# Patient Record
Sex: Female | Born: 1935 | Race: White | Hispanic: No | Marital: Married | Smoking: Never smoker
Health system: Southern US, Community
[De-identification: ages and names within clinical notes are randomized; demographics above are authoritative.]

## PROBLEM LIST (undated history)

## (undated) DIAGNOSIS — J189 Pneumonia, unspecified organism: Secondary | ICD-10-CM

## (undated) DIAGNOSIS — G250 Essential tremor: Secondary | ICD-10-CM

## (undated) DIAGNOSIS — M1990 Unspecified osteoarthritis, unspecified site: Secondary | ICD-10-CM

## (undated) DIAGNOSIS — E785 Hyperlipidemia, unspecified: Secondary | ICD-10-CM

## (undated) DIAGNOSIS — E119 Type 2 diabetes mellitus without complications: Secondary | ICD-10-CM

## (undated) DIAGNOSIS — C50412 Malignant neoplasm of upper-outer quadrant of left female breast: Secondary | ICD-10-CM

## (undated) DIAGNOSIS — K219 Gastro-esophageal reflux disease without esophagitis: Secondary | ICD-10-CM

## (undated) DIAGNOSIS — I10 Essential (primary) hypertension: Secondary | ICD-10-CM

## (undated) DIAGNOSIS — R269 Unspecified abnormalities of gait and mobility: Secondary | ICD-10-CM

## (undated) HISTORY — DX: Malignant neoplasm of upper-outer quadrant of left female breast: C50.412

## (undated) HISTORY — DX: Unspecified abnormalities of gait and mobility: R26.9

## (undated) HISTORY — PX: TUBAL LIGATION: SHX77

## (undated) HISTORY — PX: LEG SURGERY: SHX1003

## (undated) HISTORY — DX: Essential tremor: G25.0

## (undated) HISTORY — PX: WRIST SURGERY: SHX841

## (undated) NOTE — *Deleted (*Deleted)
Patient Care Team: Mosetta Putt, MD as PCP - General (Family Medicine) Claud Kelp, MD as Consulting Physician (General Surgery) Serena Croissant, MD as Consulting Physician (Hematology and Oncology) Antony Blackbird, MD as Consulting Physician (Radiation Oncology)  DIAGNOSIS:    ICD-10-CM   1. Malignant neoplasm of upper-outer quadrant of left breast in female, estrogen receptor positive (HCC)  C50.412    Z17.0     SUMMARY OF ONCOLOGIC HISTORY: Oncology History  Breast cancer of upper-outer quadrant of left female breast (HCC)  11/04/2015 Initial Diagnosis   Left breast biopsy 2:00 position: Invasive lobular cancer grade 1, ER 80%, PR 0%, Ki-67 5%, HER-2 negative ratio 1.19, screening detected left breast mass 1.6 cm, T1 cN0 stage IA clinical stage   12/17/2015 Surgery   Left Lumpectomy Derrell Lolling): ILC, 2.3 cm, Grade 1, ALH, ER 80%, PR 0%, Ki 67: 5%, HER-2 negative (1.19 ratio).   01/06/2016 -  Anti-estrogen oral therapy   Anastrozole 1 mg by mouth daily 5 years     CHIEF COMPLIANT: Follow-up of history of left breast cancer on anastrozole therapy  INTERVAL HISTORY: Rhonda [name] is a 41 y.o. with above-mentioned history of left breast cancer treated with a lumpectomy and who is currently on anastrozole. Mammogram on 12/07/19 showed no evidence of malignancy bilaterally. She presents to the clinic today for annual follow-up.  ALLERGIES:  is allergic to codeine.  MEDICATIONS:  Current Outpatient Medications  Medication Sig Dispense Refill  . allopurinol (ZYLOPRIM) 100 MG tablet Take 1 tablet (100 mg total) by mouth daily.    Marland Kitchen anastrozole (ARIMIDEX) 1 MG tablet Take 1 tablet (1 mg total) by mouth daily. 90 tablet 3  . aspirin 81 MG tablet Take 81 mg by mouth daily.    . Dulaglutide (TRULICITY) 1.5 MG/0.5ML SOPN Inject into the skin once a week.    . famotidine (PEPCID) 20 MG tablet Take 1 tablet (20 mg total) by mouth 2 (two) times daily.    . furosemide (LASIX) 20 MG tablet  Take 1 tablet (20 mg total) by mouth daily.    . hydrALAZINE (APRESOLINE) 50 MG tablet Take 50 mg by mouth 2 (two) times daily.    Marland Kitchen levothyroxine (SYNTHROID, LEVOTHROID) 50 MCG tablet Take 50 mcg by mouth daily before breakfast.    . losartan (COZAAR) 50 MG tablet Take 50 mg by mouth daily.    . metoprolol succinate (TOPROL-XL) 50 MG 24 hr tablet Take 50 mg by mouth daily. Take with or immediately following a meal.    . Multiple Vitamins-Minerals (MULTIVITAMIN & MINERAL PO) Take 1 tablet by mouth every morning.    . pravastatin (PRAVACHOL) 40 MG tablet Take 1 tablet (40 mg total) by mouth at bedtime. 30 tablet 0  . primidone (MYSOLINE) 250 MG tablet Take 0.5 tablets (125 mg total) by mouth 2 (two) times daily.    Marland Kitchen rOPINIRole (REQUIP) 1 MG tablet Take 1.5 mg by mouth at bedtime.    . sitaGLIPtin (JANUVIA) 50 MG tablet Take 2 tablets (100 mg total) by mouth daily.     No current facility-administered medications for this visit.    PHYSICAL EXAMINATION: ECOG PERFORMANCE STATUS: {CHL ONC ECOG PS:[phone]}  There were no vitals filed for this visit. There were no vitals filed for this visit.  BREAST:*** No palpable masses or nodules in either right or left breasts. No palpable axillary supraclavicular or infraclavicular adenopathy no breast tenderness or nipple discharge. (exam performed in the presence of a chaperone)  LABORATORY DATA:  I have reviewed the data as listed CMP Latest Ref Rng & Units 07/06/2017 12/16/2015 11/13/2015  Glucose 70 - 140 mg/dL 81 161(W) 960  BUN 7 - 26 mg/dL 45(W) 09(W) 11.9  Creatinine 0.60 - 1.10 mg/dL 1.47(W) 2.95(A) 2.1(H)  Sodium 136 - 145 mmol/L 139 134(L) 130(L)  Potassium 3.5 - 5.1 mmol/L 4.3 4.7 4.9  Chloride 98 - 109 mmol/L 99 98(L) -  CO2 22 - 29 mmol/L 28 28 23   Calcium 8.4 - 10.4 mg/dL 9.7 9.2 9.3  Total Protein 6.4 - 8.3 g/dL 7.9 - 7.9  Total Bilirubin 0.2 - 1.2 mg/dL 0.3 - <0.86  Alkaline Phos 40 - 150 U/L 105 - 66  AST 5 - 34 U/L 23 - 24   ALT 0 - 55 U/L 21 - 31    Lab Results  Component Value Date   WBC 6.9 07/06/2017   HGB 11.3 (L) 07/06/2017   HCT 33.9 (L) 07/06/2017   MCV 96.2 07/06/2017   PLT 210 07/06/2017   NEUTROABS 4.2 07/06/2017    ASSESSMENT & PLAN:  No problem-specific Assessment & Plan notes found for this encounter.    No orders of the defined types were placed in this encounter.  The patient has a good understanding of the overall plan. she agrees with it. she will call with any problems that may develop before the next visit here.  Total time spent: *** mins including face to face time and time spent for planning, charting and coordination of care  Serena Croissant, MD 03/25/2020  I, Kirt Boys Dorshimer, am acting as scribe for Dr. Serena Croissant.  {insert scribe attestation}

---

## 1999-04-03 ENCOUNTER — Encounter: Admission: RE | Admit: 1999-04-03 | Discharge: 1999-04-03 | Payer: Self-pay | Admitting: Emergency Medicine

## 1999-04-03 ENCOUNTER — Encounter: Payer: Self-pay | Admitting: Emergency Medicine

## 2000-05-04 ENCOUNTER — Other Ambulatory Visit: Admission: RE | Admit: 2000-05-04 | Discharge: 2000-05-04 | Payer: Self-pay | Admitting: Emergency Medicine

## 2000-11-26 ENCOUNTER — Encounter: Payer: Self-pay | Admitting: Emergency Medicine

## 2000-11-26 ENCOUNTER — Encounter: Admission: RE | Admit: 2000-11-26 | Discharge: 2000-11-26 | Payer: Self-pay | Admitting: Emergency Medicine

## 2002-06-01 ENCOUNTER — Encounter: Admission: RE | Admit: 2002-06-01 | Discharge: 2002-08-30 | Payer: Self-pay | Admitting: Emergency Medicine

## 2007-02-23 ENCOUNTER — Emergency Department (HOSPITAL_COMMUNITY): Admission: EM | Admit: 2007-02-23 | Discharge: 2007-02-23 | Payer: Self-pay | Admitting: Emergency Medicine

## 2007-05-24 ENCOUNTER — Emergency Department (HOSPITAL_COMMUNITY): Admission: EM | Admit: 2007-05-24 | Discharge: 2007-05-24 | Payer: Self-pay | Admitting: Emergency Medicine

## 2010-10-28 ENCOUNTER — Emergency Department (HOSPITAL_COMMUNITY)
Admission: EM | Admit: 2010-10-28 | Discharge: 2010-10-29 | Disposition: A | Discharge status: Home / Self Care | Payer: Medicare Other | Attending: Emergency Medicine | Admitting: Emergency Medicine

## 2010-10-28 ENCOUNTER — Emergency Department (HOSPITAL_COMMUNITY): Payer: Medicare Other

## 2010-10-28 DIAGNOSIS — I10 Essential (primary) hypertension: Secondary | ICD-10-CM | POA: Insufficient documentation

## 2010-10-28 DIAGNOSIS — G2581 Restless legs syndrome: Secondary | ICD-10-CM | POA: Insufficient documentation

## 2010-10-28 DIAGNOSIS — R1013 Epigastric pain: Secondary | ICD-10-CM | POA: Insufficient documentation

## 2010-10-28 DIAGNOSIS — R079 Chest pain, unspecified: Secondary | ICD-10-CM | POA: Insufficient documentation

## 2010-10-28 DIAGNOSIS — K219 Gastro-esophageal reflux disease without esophagitis: Secondary | ICD-10-CM | POA: Insufficient documentation

## 2010-10-28 LAB — COMPREHENSIVE METABOLIC PANEL
Alkaline Phosphatase: 78 U/L (ref 39–117)
BUN: 20 mg/dL (ref 6–23)
GFR calc non Af Amer: >60 mL/min (ref >60)
Glucose, Bld: 103 mg/dL — ABNORMAL HIGH (ref 70–99)
Potassium: 4 mEq/L (ref 3.5–5.1)
Total Protein: 7.3 g/dL (ref 6.0–8.3)

## 2010-10-28 LAB — CBC
HCT: 32.2 % — ABNORMAL LOW (ref 36.0–46.0)
Hemoglobin: 11 g/dL — ABNORMAL LOW (ref 12.0–15.0)
MCH: 30 pg (ref 26.0–34.0)
MCHC: 34.2 g/dL (ref 30.0–36.0)
MCV: 87.7 fL (ref 78.0–100.0)
Platelets: 215 10*3/uL (ref 150–400)
RBC: 3.67 MIL/uL — ABNORMAL LOW (ref 3.87–5.11)
RDW: 12.5 % (ref 11.5–15.5)
WBC: 5.9 10*3/uL (ref 4.0–10.5)

## 2010-10-29 LAB — LIPASE, BLOOD: Lipase: 39 U/L (ref 11–59)

## 2010-10-29 LAB — CK TOTAL AND CKMB (NOT AT ARMC)
Relative Index: INVALID (ref 0.0–2.5)
Total CK: 92 U/L (ref 7–177)

## 2011-03-05 LAB — BASIC METABOLIC PANEL
BUN: 11
CO2: 32
Chloride: 102
Creatinine, Ser: 0.82
Glucose, Bld: 121 — ABNORMAL HIGH

## 2011-03-05 LAB — DIFFERENTIAL
Basophils Relative: 1
Eosinophils Absolute: 0
Eosinophils Relative: 0
Neutrophils Relative %: 65

## 2011-03-05 LAB — D-DIMER, QUANTITATIVE: D-Dimer, Quant: 0.46

## 2011-03-05 LAB — POCT CARDIAC MARKERS
CKMB, poc: <1 — ABNORMAL LOW
Operator id: 270111
Troponin i, poc: <0.05

## 2011-03-05 LAB — CBC
MCHC: 33.7
MCV: 91.4
Platelets: 188
RDW: 14.1 — ABNORMAL HIGH

## 2011-10-30 ENCOUNTER — Ambulatory Visit (INDEPENDENT_AMBULATORY_CARE_PROVIDER_SITE_OTHER): Payer: Medicare Other | Admitting: *Deleted

## 2011-10-30 DIAGNOSIS — R0989 Other specified symptoms and signs involving the circulatory and respiratory systems: Secondary | ICD-10-CM

## 2011-11-05 NOTE — Procedures (Unsigned)
CAROTID DUPLEX EXAM  INDICATION:  Bilateral carotid bruit  HISTORY: Diabetes:  Yes Cardiac:  No Hypertension:  Yes Smoking:  Previous Previous Surgery:  No CV History: Amaurosis Fugax No, Paresthesias No, Hemiparesis No                                      RIGHT             LEFT Brachial systolic pressure:         140               150 Brachial Doppler waveforms:         WNL               WNL Vertebral direction of flow:        Antegrade         Antegrade DUPLEX VELOCITIES (cm/sec) CCA peak systolic                   73                104 ECA peak systolic                   158               79 ICA peak systolic                   98                164 ICA end diastolic                   25                48 PLAQUE MORPHOLOGY:                  Heterogeneous     Heterogeneous PLAQUE AMOUNT:                      Mild              Moderate PLAQUE LOCATION:                    ICA               ICA  IMPRESSION: 1. 1%-39% right internal carotid artery plaquing. 2. 40%-59% (low end) left internal carotid artery stenosis. 3. Bilateral vertebral arteries are within normal limits.  ___________________________________________ Larina Earthly, M.D.  LT/MEDQ  D:  10/30/2011  T:  10/30/2011  Job:  [id number]

## 2012-05-13 ENCOUNTER — Other Ambulatory Visit: Payer: Self-pay | Admitting: Family Medicine

## 2012-05-13 DIAGNOSIS — R270 Ataxia, unspecified: Secondary | ICD-10-CM

## 2012-05-13 DIAGNOSIS — R42 Dizziness and giddiness: Secondary | ICD-10-CM

## 2012-05-20 ENCOUNTER — Other Ambulatory Visit: Payer: Medicare Other

## 2012-05-20 ENCOUNTER — Ambulatory Visit
Admission: RE | Admit: 2012-05-20 | Discharge: 2012-05-20 | Disposition: A | Discharge status: Home / Self Care | Payer: Medicare Other | Source: Ambulatory Visit | Attending: Family Medicine | Admitting: Family Medicine

## 2012-05-20 DIAGNOSIS — R270 Ataxia, unspecified: Secondary | ICD-10-CM

## 2012-05-20 DIAGNOSIS — R42 Dizziness and giddiness: Secondary | ICD-10-CM

## 2012-05-20 MED ORDER — GADOBENATE DIMEGLUMINE 529 MG/ML IV SOLN
15.0000 mL | Freq: Once | INTRAVENOUS | Status: AC | PRN
  Administered 2012-05-20: 15 mL via INTRAVENOUS

## 2012-10-26 ENCOUNTER — Other Ambulatory Visit (INDEPENDENT_AMBULATORY_CARE_PROVIDER_SITE_OTHER): Payer: Medicare Other | Admitting: *Deleted

## 2012-10-26 ENCOUNTER — Encounter: Payer: Self-pay | Admitting: Family Medicine

## 2012-10-26 DIAGNOSIS — I6529 Occlusion and stenosis of unspecified carotid artery: Secondary | ICD-10-CM

## 2013-11-01 ENCOUNTER — Ambulatory Visit (HOSPITAL_COMMUNITY)
Admission: RE | Admit: 2013-11-01 | Discharge: 2013-11-01 | Disposition: A | Discharge status: Home / Self Care | Payer: Medicare Other | Source: Ambulatory Visit | Attending: Vascular Surgery | Admitting: Vascular Surgery

## 2013-11-01 ENCOUNTER — Other Ambulatory Visit (HOSPITAL_COMMUNITY): Payer: Self-pay | Admitting: Family Medicine

## 2013-11-01 DIAGNOSIS — I6529 Occlusion and stenosis of unspecified carotid artery: Secondary | ICD-10-CM | POA: Insufficient documentation

## 2013-11-01 DIAGNOSIS — R0989 Other specified symptoms and signs involving the circulatory and respiratory systems: Secondary | ICD-10-CM

## 2014-09-26 ENCOUNTER — Emergency Department (HOSPITAL_COMMUNITY)
Admission: EM | Admit: 2014-09-26 | Discharge: 2014-09-27 | Disposition: A | Discharge status: Home / Self Care | Payer: Medicare Other | Attending: Emergency Medicine | Admitting: Emergency Medicine

## 2014-09-26 DIAGNOSIS — R112 Nausea with vomiting, unspecified: Secondary | ICD-10-CM | POA: Diagnosis not present

## 2014-09-26 DIAGNOSIS — Z7982 Long term (current) use of aspirin: Secondary | ICD-10-CM | POA: Insufficient documentation

## 2014-09-26 DIAGNOSIS — R1084 Generalized abdominal pain: Secondary | ICD-10-CM | POA: Insufficient documentation

## 2014-09-26 DIAGNOSIS — Z79899 Other long term (current) drug therapy: Secondary | ICD-10-CM | POA: Insufficient documentation

## 2014-09-26 DIAGNOSIS — R197 Diarrhea, unspecified: Secondary | ICD-10-CM | POA: Diagnosis not present

## 2014-09-26 NOTE — ED Notes (Signed)
Pt comes from home with onset n/v/d starting this afternoon. Pt had acute onset of nausea with 3 episodes of diarrhea. Temp of 101.4. Pt took tylenol around 2pm.

## 2014-09-26 NOTE — ED Notes (Signed)
Bed: WA08 Expected date:  Expected time:  Means of arrival:  Comments: EMS 81 yo female N/V/D fever

## 2014-09-27 DIAGNOSIS — R112 Nausea with vomiting, unspecified: Secondary | ICD-10-CM | POA: Diagnosis not present

## 2014-09-27 LAB — CBC WITH DIFFERENTIAL/PLATELET
BASOS ABS: 0 10*3/uL (ref 0.0–0.1)
Basophils Relative: 0 % (ref 0–1)
Eosinophils Absolute: 0 10*3/uL (ref 0.0–0.7)
Eosinophils Relative: 0 % (ref 0–5)
HEMATOCRIT: 37.1 % (ref 36.0–46.0)
Hemoglobin: 12 g/dL (ref 12.0–15.0)
Lymphocytes Relative: 3 % — ABNORMAL LOW (ref 12–46)
Lymphs Abs: 0.2 10*3/uL — ABNORMAL LOW (ref 0.7–4.0)
MCH: 30.2 pg (ref 26.0–34.0)
MCHC: 32.3 g/dL (ref 30.0–36.0)
MCV: 93.2 fL (ref 78.0–100.0)
MONO ABS: 0.2 10*3/uL (ref 0.1–1.0)
Monocytes Relative: 3 % (ref 3–12)
Neutro Abs: 5.8 10*3/uL (ref 1.7–7.7)
Neutrophils Relative %: 94 % — ABNORMAL HIGH (ref 43–77)
Platelets: 157 10*3/uL (ref 150–400)
RBC: 3.98 MIL/uL (ref 3.87–5.11)
RDW: 13.1 % (ref 11.5–15.5)
WBC: 6.1 10*3/uL (ref 4.0–10.5)

## 2014-09-27 LAB — COMPREHENSIVE METABOLIC PANEL
ALBUMIN: 3.7 g/dL (ref 3.5–5.0)
ALT: 31 U/L (ref 14–54)
ANION GAP: 8 (ref 5–15)
AST: 30 U/L (ref 15–41)
Alkaline Phosphatase: 54 U/L (ref 38–126)
BUN: 21 mg/dL — ABNORMAL HIGH (ref 6–20)
CHLORIDE: 107 mmol/L (ref 101–111)
CO2: 22 mmol/L (ref 22–32)
Calcium: 8.3 mg/dL — ABNORMAL LOW (ref 8.9–10.3)
Creatinine, Ser: 1.04 mg/dL — ABNORMAL HIGH (ref 0.44–1.00)
GFR calc non Af Amer: 50 mL/min — ABNORMAL LOW (ref >60)
GFR, EST AFRICAN AMERICAN: 58 mL/min — AB (ref >60)
Glucose, Bld: 170 mg/dL — ABNORMAL HIGH (ref 70–99)
POTASSIUM: 4.5 mmol/L (ref 3.5–5.1)
SODIUM: 137 mmol/L (ref 135–145)
Total Bilirubin: 0.3 mg/dL (ref 0.3–1.2)
Total Protein: 6.8 g/dL (ref 6.5–8.1)

## 2014-09-27 LAB — URINE MICROSCOPIC-ADD ON

## 2014-09-27 LAB — I-STAT CG4 LACTIC ACID, ED
Lactic Acid, Venous: 2.24 mmol/L (ref 0.5–2.0)
Lactic Acid, Venous: 1.46 mmol/L (ref 0.5–2.0)

## 2014-09-27 LAB — URINALYSIS, ROUTINE W REFLEX MICROSCOPIC
BILIRUBIN URINE: NEGATIVE
Glucose, UA: NEGATIVE mg/dL
HGB URINE DIPSTICK: NEGATIVE
Ketones, ur: NEGATIVE mg/dL
Nitrite: NEGATIVE
PROTEIN: NEGATIVE mg/dL
Specific Gravity, Urine: 1.019 (ref 1.005–1.030)
Urobilinogen, UA: 0.2 mg/dL (ref 0.0–1.0)
pH: 5.5 (ref 5.0–8.0)

## 2014-09-27 LAB — I-STAT TROPONIN, ED: Troponin i, poc: 0 ng/mL (ref 0.00–0.08)

## 2014-09-27 LAB — LIPASE, BLOOD: Lipase: 54 U/L — ABNORMAL HIGH (ref 22–51)

## 2014-09-27 MED ORDER — ROPINIROLE HCL 1 MG PO TABS
1.0000 mg | ORAL_TABLET | ORAL | Status: AC
  Administered 2014-09-27: 1 mg via ORAL
  Filled 2014-09-27: qty 1

## 2014-09-27 MED ORDER — ACETAMINOPHEN 325 MG PO TABS
650.0000 mg | ORAL_TABLET | Freq: Once | ORAL | Status: AC
  Administered 2014-09-27: 650 mg via ORAL
  Filled 2014-09-27: qty 2

## 2014-09-27 MED ORDER — ONDANSETRON HCL 4 MG PO TABS: 4.0000 mg | ORAL_TABLET | Freq: Four times a day (QID) | ORAL | Status: DC

## 2014-09-27 MED ORDER — SODIUM CHLORIDE 0.9 % IV BOLUS (SEPSIS)
500.0000 mL | INTRAVENOUS | Status: AC
  Administered 2014-09-27: 500 mL via INTRAVENOUS

## 2014-09-27 MED ORDER — GI COCKTAIL ~~LOC~~
30.0000 mL | Freq: Once | ORAL | Status: AC
  Administered 2014-09-27: 30 mL via ORAL
  Filled 2014-09-27: qty 30

## 2014-09-27 NOTE — Discharge Instructions (Signed)
Please follow the directions provided. Be sure to follow-up with your primary care doctor to ensure you're getting better. Continue to drink plenty of fluids to stay well hydrated. May use Tylenol every 4 hours for any chills or fever. May use the Zofran as needed for nausea. Don't hesitate to return for any new, worsening, or concerning symptoms.   SEEK IMMEDIATE MEDICAL CARE IF:  You are unable to keep fluids down.  You do not urinate at least once every 6 to 8 hours.  You develop shortness of breath.  You notice blood in your stool or vomit. This may look like coffee grounds.  You have abdominal pain that increases or is concentrated in one small area (localized).  You have persistent vomiting or diarrhea.  You have a fever.

## 2014-09-27 NOTE — ED Provider Notes (Signed)
CSN: [id number]     Arrival date & time 09/26/14  2234 History   First MD Initiated Contact with Patient 09/27/14 0136     Chief Complaint  Patient presents with  . N/V/D    (Consider location/radiation/quality/duration/timing/severity/associated sxs/prior Treatment) HPI Rhonda [name] is a 81 yo female presenting with report of N/V/D. She states she woke up this morning and began having several episodes of diarrhea. She took an immodium and the diarrhea resolved.  She had a dentist appointment today and noticed the teeth cleaning paste made her feel more nauseated than usual.  She tried to eat lunch and became very nauseated and vomited.  She reports vomiting 6-7 times.  The last time was prior to being transported to the ED before receiving zofran from EMS.  She reports feeling chills throughout the day and once while using the bathroom she became very weak and her husband could not help her stand.  She reports mild abd pain but nothing significant.  She denies bilious/bloody emesis or melena or hematachezia.     No past medical history on file. No past surgical history on file. No family history on file. History  Substance Use Topics  . Smoking status: Not on file  . Smokeless tobacco: Not on file  . Alcohol Use: Not on file   OB History    No data available     Review of Systems  Constitutional: Positive for chills. Negative for fever.  HENT: Negative for sore throat.   Eyes: Negative for visual disturbance.  Respiratory: Negative for cough and shortness of breath.   Cardiovascular: Negative for chest pain and leg swelling.  Gastrointestinal: Positive for nausea, vomiting, abdominal pain and diarrhea.  Genitourinary: Negative for dysuria.  Musculoskeletal: Negative for myalgias.  Skin: Negative for rash.  Neurological: Negative for weakness, numbness and headaches.      Allergies  Codeine  Home Medications   Prior to Admission medications   Medication Sig Start Date  End Date Taking? Authorizing Provider  acetaminophen (TYLENOL) 500 MG chewable tablet Chew 1,000 mg by mouth every 6 (six) hours as needed for fever (fever).   Yes Historical Provider, MD  aspirin 81 MG tablet Take 81 mg by mouth daily.   Yes Historical Provider, MD  Calcium Carbonate-Vitamin D (CALTRATE 600+D PO) Take 2 tablets by mouth daily.   Yes Historical Provider, MD  Ferrous Sulfate (IRON) 325 (65 FE) MG TABS Take 1 tablet by mouth daily.   Yes Historical Provider, MD  losartan (COZAAR) 50 MG tablet Take 50 mg by mouth daily.   Yes Historical Provider, MD  metFORMIN (GLUCOPHAGE-XR) 500 MG 24 hr tablet Take 1,000 mg by mouth 2 (two) times daily.   Yes Historical Provider, MD  Multiple Vitamins-Minerals (MULTIVITAMIN & MINERAL PO) Take 1 tablet by mouth every morning.   Yes Historical Provider, MD  pravastatin (PRAVACHOL) 80 MG tablet Take 80 mg by mouth at bedtime.   Yes Historical Provider, MD  rOPINIRole (REQUIP) 1 MG tablet Take 1.5 mg by mouth at bedtime.   Yes Historical Provider, MD  sitaGLIPtin (JANUVIA) 100 MG tablet Take 100 mg by mouth daily.   Yes Historical Provider, MD   BP 124/49 mmHg  Pulse 101  Temp(Src) 99.4 F (37.4 C) (Oral)  Resp 20  SpO2 95% Physical Exam  Constitutional: She is oriented to person, place, and time. She appears well-developed and well-nourished. No distress.  HENT:  Head: Normocephalic and atraumatic.  Mouth/Throat: Mucous membranes are dry.  Eyes:  Conjunctivae are normal.  Neck: Neck supple.  Cardiovascular: Normal rate, regular rhythm and intact distal pulses.   Pulmonary/Chest: Effort normal and breath sounds normal. No respiratory distress. She has no wheezes. She has no rales. She exhibits no tenderness.  Abdominal: Soft. She exhibits no distension and no mass. There is no hepatosplenomegaly. There is generalized tenderness. There is no rigidity, no rebound, no guarding, no CVA tenderness, no tenderness at McBurney's point and negative  Murphy's sign.    Mild general tenderness, worse over epigastrium, NO localized LLQ or RLQ TTP  Musculoskeletal: She exhibits no tenderness.  Lymphadenopathy:    She has no cervical adenopathy.  Neurological: She is alert and oriented to person, place, and time. No cranial nerve deficit. Coordination normal.  Skin: Skin is warm and dry. No rash noted. She is not diaphoretic.  Psychiatric: She has a normal mood and affect.  Nursing note and vitals reviewed.   ED Course  Procedures (including critical care time) Labs Review Labs Reviewed  CBC WITH DIFFERENTIAL/PLATELET - Abnormal; Notable for the following:    Neutrophils Relative % 94 (*)    Lymphocytes Relative 3 (*)    Lymphs Abs 0.2 (*)    All other components within normal limits  COMPREHENSIVE METABOLIC PANEL - Abnormal; Notable for the following:    Glucose, Bld 170 (*)    BUN 21 (*)    Creatinine, Ser 1.04 (*)    Calcium 8.3 (*)    GFR calc non Af Amer 50 (*)    GFR calc Af Amer 58 (*)    All other components within normal limits  LIPASE, BLOOD - Abnormal; Notable for the following:    Lipase 54 (*)    All other components within normal limits  URINALYSIS, ROUTINE W REFLEX MICROSCOPIC - Abnormal; Notable for the following:    Leukocytes, UA TRACE (*)    All other components within normal limits  URINE MICROSCOPIC-ADD ON - Abnormal; Notable for the following:    Bacteria, UA FEW (*)    All other components within normal limits  I-STAT CG4 LACTIC ACID, ED - Abnormal; Notable for the following:    Lactic Acid, Venous 2.24 (*)    All other components within normal limits  I-STAT TROPOININ, ED  I-STAT CG4 LACTIC ACID, ED    Imaging Review No results found.   EKG Interpretation   Date/Time:  Wednesday Sep 26 2014 23:42:11 EDT Ventricular Rate:  97 PR Interval:  137 QRS Duration: 74 QT Interval:  335 QTC Calculation: 425 R Axis:   -16 Text Interpretation:  Sinus rhythm Borderline left axis deviation Low    voltage, precordial leads since last tracing no significant change  Confirmed by WENTZ  MD, ELLIOTT ([id number]) on 09/27/2014 12:19:10 AM      MDM   Final diagnoses:  Nausea vomiting and diarrhea   81 yo with nausea, vomiting and diarrhea today. She had an episode of weakness while sitting on the toilet but that has resolved after IVF.  Her nausea, vomiting and diarrhea has not recurred since being in the ED after treatment with zofran.  She was febrile in the ED with elevated lactate to 2.24 but normal WBC and no focal abd tenderness, consistent with a viral gastroenteritis and dehydration. Discussed case with Dr. Sharol Given. Pt given additional IVF, tylenol and PO challenged.  Pt defervesced after treatment. Pt tolerating POs, epigastric discomfort resolved after GI cocktail.  Pt ambulated in hall without weakness or difficulty. Her repeat lactic acid  is normal.  Pt is well-appearing, in no acute distress and vital signs reviewed an not concerning. She appears safe to be discharged.  Discharge include follow-up with their PCP.  Return precautions provided. Pt aware of plan and in agreement.   Filed Vitals:   09/27/14 0059 09/27/14 0200 09/27/14 0206 09/27/14 0438  BP: 124/49 146/59  136/46  Pulse: 101 100  97  Temp:   102.8 F (39.3 C) 98.4 F (36.9 C)  TempSrc:   Oral Oral  Resp: 20 28  21   SpO2: 95% 94%  94%   Meds given in ED:  Medications  sodium chloride 0.9 % bolus 500 mL (0 mLs Intravenous Stopped 09/27/14 0441)  acetaminophen (TYLENOL) tablet 650 mg (650 mg Oral Given 09/27/14 0234)  rOPINIRole (REQUIP) tablet 1 mg (1 mg Oral Given 09/27/14 0313)  gi cocktail (Maalox,Lidocaine,Donnatal) (30 mLs Oral Given 09/27/14 0432)    Discharge Medication List as of 09/27/2014  5:11 AM    START taking these medications   Details  ondansetron (ZOFRAN) 4 MG tablet Take 1 tablet (4 mg total) by mouth every 6 (six) hours., Starting 09/27/2014, Until Discontinued, Print           Britt Bottom,  NP 09/27/14 [place]  Linton Flemings, MD 09/28/14 [phone]

## 2014-09-27 NOTE — ED Notes (Signed)
Pt ambulated well with walker.

## 2014-09-27 NOTE — ED Notes (Signed)
Lactic acid was given to MD.

## 2014-09-27 NOTE — ED Notes (Signed)
Informed the pt that a urine specimen is needed. 

## 2015-09-03 ENCOUNTER — Ambulatory Visit
Admission: RE | Admit: 2015-09-03 | Discharge: 2015-09-03 | Disposition: A | Discharge status: Home / Self Care | Payer: Medicare Other | Source: Ambulatory Visit | Attending: Family Medicine | Admitting: Family Medicine

## 2015-09-03 ENCOUNTER — Other Ambulatory Visit: Payer: Self-pay | Admitting: Family Medicine

## 2015-09-03 DIAGNOSIS — R0989 Other specified symptoms and signs involving the circulatory and respiratory systems: Secondary | ICD-10-CM

## 2015-09-07 ENCOUNTER — Emergency Department (HOSPITAL_BASED_OUTPATIENT_CLINIC_OR_DEPARTMENT_OTHER)
Admission: EM | Admit: 2015-09-07 | Discharge: 2015-09-07 | Disposition: A | Discharge status: Home / Self Care | Payer: Medicare Other | Attending: Emergency Medicine | Admitting: Emergency Medicine

## 2015-09-07 ENCOUNTER — Emergency Department (HOSPITAL_BASED_OUTPATIENT_CLINIC_OR_DEPARTMENT_OTHER): Payer: Medicare Other

## 2015-09-07 ENCOUNTER — Encounter (HOSPITAL_BASED_OUTPATIENT_CLINIC_OR_DEPARTMENT_OTHER): Payer: Self-pay

## 2015-09-07 DIAGNOSIS — I10 Essential (primary) hypertension: Secondary | ICD-10-CM | POA: Diagnosis not present

## 2015-09-07 DIAGNOSIS — Z7982 Long term (current) use of aspirin: Secondary | ICD-10-CM | POA: Insufficient documentation

## 2015-09-07 DIAGNOSIS — Y999 Unspecified external cause status: Secondary | ICD-10-CM | POA: Diagnosis not present

## 2015-09-07 DIAGNOSIS — Y939 Activity, unspecified: Secondary | ICD-10-CM | POA: Diagnosis not present

## 2015-09-07 DIAGNOSIS — E785 Hyperlipidemia, unspecified: Secondary | ICD-10-CM | POA: Insufficient documentation

## 2015-09-07 DIAGNOSIS — R51 Headache: Secondary | ICD-10-CM | POA: Diagnosis present

## 2015-09-07 DIAGNOSIS — E119 Type 2 diabetes mellitus without complications: Secondary | ICD-10-CM | POA: Insufficient documentation

## 2015-09-07 DIAGNOSIS — W06XXXA Fall from bed, initial encounter: Secondary | ICD-10-CM | POA: Diagnosis not present

## 2015-09-07 DIAGNOSIS — Z7984 Long term (current) use of oral hypoglycemic drugs: Secondary | ICD-10-CM | POA: Diagnosis not present

## 2015-09-07 DIAGNOSIS — Y929 Unspecified place or not applicable: Secondary | ICD-10-CM | POA: Insufficient documentation

## 2015-09-07 DIAGNOSIS — Z79899 Other long term (current) drug therapy: Secondary | ICD-10-CM | POA: Diagnosis not present

## 2015-09-07 DIAGNOSIS — W19XXXA Unspecified fall, initial encounter: Secondary | ICD-10-CM

## 2015-09-07 HISTORY — DX: Pneumonia, unspecified organism: J18.9

## 2015-09-07 HISTORY — DX: Hyperlipidemia, unspecified: E78.5

## 2015-09-07 HISTORY — DX: Essential (primary) hypertension: I10

## 2015-09-07 HISTORY — DX: Gastro-esophageal reflux disease without esophagitis: K21.9

## 2015-09-07 HISTORY — DX: Type 2 diabetes mellitus without complications: E11.9

## 2015-09-07 NOTE — ED Notes (Signed)
VS completed just prior to discharge, and all were WNL. VS did not cross over into chart.

## 2015-09-07 NOTE — ED Notes (Addendum)
Recent treatment for PNA. CBG 134.

## 2015-09-07 NOTE — ED Notes (Signed)
Pt reports fall out of bed, hit head against table, denies neck/back pain, no nausea, no emesis, no LOC during event, pt alert, oriented, appropriate, pt denies using assistive devices at home.

## 2015-09-07 NOTE — Discharge Instructions (Signed)
1. Medications: usual home medications 2. Treatment: rest, drink plenty of fluids 3. Follow Up: please followup with your primary doctor for discussion of your diagnoses and further evaluation after today's visit; please return to the ER for new or worsening symptoms

## 2015-09-07 NOTE — ED Provider Notes (Signed)
CSN: [id number]     Arrival date & time 09/07/15  2007 History   First MD Initiated Contact with Patient 09/07/15 2014     Chief Complaint  Patient presents with  . Fall    HPI   Rhonda [name] is a 82 y.o. female with a PMH of DM, HTN, HLD who presents to the ED with fall. She states she was trying to sit down to get into her bed when she fell and hit the right side of her head on her bedside table. She reports she has mild soreness. She denies LOC, headache, lightheadedness, dizziness, vision changes, neck pain, back pain, nausea, vomiting, numbness, weakness, paresthesia, additional injury. She notes she lives in an assisted living center and pulled the call button for the nurse, who subsequently called EMS.   Past Medical History  Diagnosis Date  . Diabetes mellitus without complication ([place])   . Hypertension   . Pneumonia   . GERD (gastroesophageal reflux disease)   . Hyperlipidemia    History reviewed. No pertinent past surgical history. History reviewed. No pertinent family history. Social History  Substance Use Topics  . Smoking status: Never Smoker   . Smokeless tobacco: None  . Alcohol Use: No   OB History    No data available      Review of Systems  Eyes: Negative for visual disturbance.  Gastrointestinal: Negative for nausea and vomiting.  Musculoskeletal: Negative for back pain and neck pain.  Skin: Negative for wound.  Neurological: Negative for dizziness, syncope, weakness, light-headedness, numbness and headaches.  All other systems reviewed and are negative.     Allergies  Codeine  Home Medications   Prior to Admission medications   Medication Sig Start Date End Date Taking? Authorizing Provider  acetaminophen (TYLENOL) 500 MG chewable tablet Chew 1,000 mg by mouth every 6 (six) hours as needed for fever (fever).   Yes Historical Provider, MD  aspirin 81 MG tablet Take 81 mg by mouth daily.   Yes Historical Provider, MD  Calcium Carbonate-Vitamin  D (CALTRATE 600+D PO) Take 2 tablets by mouth daily.   Yes Historical Provider, MD  losartan (COZAAR) 50 MG tablet Take 50 mg by mouth daily.   Yes Historical Provider, MD  metFORMIN (GLUCOPHAGE-XR) 500 MG 24 hr tablet Take 1,000 mg by mouth 2 (two) times daily.   Yes Historical Provider, MD  Multiple Vitamins-Minerals (MULTIVITAMIN & MINERAL PO) Take 1 tablet by mouth every morning.   Yes Historical Provider, MD  ondansetron (ZOFRAN) 4 MG tablet Take 1 tablet (4 mg total) by mouth every 6 (six) hours. 09/27/14  Yes Britt Bottom, NP  pravastatin (PRAVACHOL) 80 MG tablet Take 80 mg by mouth at bedtime.   Yes Historical Provider, MD  rOPINIRole (REQUIP) 1 MG tablet Take 1.5 mg by mouth at bedtime.   Yes Historical Provider, MD  sitaGLIPtin (JANUVIA) 100 MG tablet Take 100 mg by mouth daily.   Yes Historical Provider, MD  Ferrous Sulfate (IRON) 325 (65 FE) MG TABS Take 1 tablet by mouth daily.    Historical Provider, MD    BP 156/73 mmHg  Pulse 79  Temp(Src) 98.4 F (36.9 C) (Oral)  Resp 16  SpO2 91% Physical Exam  Constitutional: She is oriented to person, place, and time. She appears well-developed and well-nourished. No distress.  HENT:  Head: Normocephalic and atraumatic. Head is without raccoon's eyes, without Battle's sign, without abrasion, without contusion and without laceration.  Right Ear: Hearing, tympanic membrane, external ear and  ear canal normal. No drainage. No hemotympanum.  Left Ear: Hearing, tympanic membrane, external ear and ear canal normal. No drainage. No hemotympanum.  Nose: Nose normal.  Mouth/Throat: Uvula is midline, oropharynx is clear and moist and mucous membranes are normal.  Eyes: Conjunctivae, EOM and lids are normal. Pupils are equal, round, and reactive to light. Right eye exhibits no discharge. Left eye exhibits no discharge. No scleral icterus.  Neck: Normal range of motion. Neck supple.  Cardiovascular: Normal rate, regular rhythm, normal heart  sounds, intact distal pulses and normal pulses.   Pulmonary/Chest: Effort normal and breath sounds normal. No respiratory distress. She has no wheezes. She has no rales.  Abdominal: Soft. Normal appearance and bowel sounds are normal. She exhibits no distension and no mass. There is no tenderness. There is no rigidity, no rebound and no guarding.  Musculoskeletal: Normal range of motion. She exhibits no edema or tenderness.  Neurological: She is alert and oriented to person, place, and time. She has normal strength. No cranial nerve deficit or sensory deficit. GCS eye subscore is 4. GCS verbal subscore is 5. GCS motor subscore is 6.  Skin: Skin is warm, dry and intact. No rash noted. She is not diaphoretic. No erythema. No pallor.  Psychiatric: She has a normal mood and affect. Her speech is normal and behavior is normal.  Nursing note and vitals reviewed.   ED Course  Procedures (including critical care time)  Labs Review Labs Reviewed - No data to display  Imaging Review Ct Head Wo Contrast  09/07/2015  CLINICAL DATA:  Fall with head trauma and posterior right head pain. EXAM: CT HEAD WITHOUT CONTRAST TECHNIQUE: Contiguous axial images were obtained from the base of the skull through the vertex without intravenous contrast. COMPARISON:  05/20/2012 brain MRI. FINDINGS: No evidence of parenchymal hemorrhage or extra-axial fluid collection. No mass lesion, mass effect, or midline shift. No CT evidence of acute infarction. Intracranial atherosclerosis. Nonspecific mild subcortical and periventricular white matter hypodensity, most in keeping with chronic small vessel ischemic change. Cerebral volume is age appropriate. No ventriculomegaly. The visualized paranasal sinuses are essentially clear. The mastoid air cells are unopacified. No evidence of calvarial fracture. IMPRESSION: 1. No evidence of acute intracranial abnormality. No evidence of calvarial fracture. 2. Intracranial atherosclerosis and  mild chronic small vessel ischemia. Electronically Signed   By: Ilona Sorrel M.D.   On: 09/07/2015 20:51   I have personally reviewed and evaluated these images as part of my medical decision-making.   EKG Interpretation None      MDM   Final diagnoses:  Fall  Fall, initial encounter    82 year old female presents with fall. States she hit the right side of her head while trying to sit in her bed. Denies LOC, headache, lightheadedness, dizziness, vision changes, neck pain, back pain, nausea, vomiting, numbness, weakness, paresthesia, additional injury.  Patient is afebrile. Vital signs stable. Head normocephalic and atraumatic. GCS 15. Normal neuro exam with no focal deficit. TMs clear bilaterally. Heart RRR. Lungs clear to auscultation bilaterally. Abdomen soft, non-tender, non-distended. No TTP to upper extremities, chest wall, pelvis, or lower extremities bilaterally. Patient moves all extremities without difficulty.  Head CT negative for acute intracranial abnormality. Patient discussed with and seen by Dr. Alfonse Spruce. She is nontoxic and well-appearing, feel she is stable for discharge at this time. Patient to follow-up with PCP. Strict return precautions discussed. Patient verbalizes her understanding and is in agreement with plan.  BP 156/73 mmHg  Pulse  79  Temp(Src) 98.4 F (36.9 C) (Oral)  Resp 16  SpO2 91%     Marella Chimes, PA-C 09/07/15 2206  Harvel Quale, MD 09/08/15 [phone]

## 2015-10-02 ENCOUNTER — Other Ambulatory Visit: Payer: Self-pay | Admitting: Family Medicine

## 2015-10-02 ENCOUNTER — Ambulatory Visit
Admission: RE | Admit: 2015-10-02 | Discharge: 2015-10-02 | Disposition: A | Discharge status: Home / Self Care | Payer: Medicare Other | Source: Ambulatory Visit | Attending: Family Medicine | Admitting: Family Medicine

## 2015-10-02 DIAGNOSIS — Z09 Encounter for follow-up examination after completed treatment for conditions other than malignant neoplasm: Secondary | ICD-10-CM

## 2015-11-04 ENCOUNTER — Other Ambulatory Visit: Payer: Self-pay | Admitting: Radiology

## 2015-11-06 ENCOUNTER — Telehealth: Payer: Self-pay | Admitting: *Deleted

## 2015-11-06 ENCOUNTER — Ambulatory Visit (HOSPITAL_COMMUNITY)
Admission: RE | Admit: 2015-11-06 | Discharge: 2015-11-06 | Disposition: A | Discharge status: Home / Self Care | Payer: Medicare Other | Source: Ambulatory Visit | Attending: Vascular Surgery | Admitting: Vascular Surgery

## 2015-11-06 ENCOUNTER — Encounter: Payer: Self-pay | Admitting: *Deleted

## 2015-11-06 ENCOUNTER — Other Ambulatory Visit (HOSPITAL_COMMUNITY): Payer: Self-pay | Admitting: Family Medicine

## 2015-11-06 DIAGNOSIS — E785 Hyperlipidemia, unspecified: Secondary | ICD-10-CM | POA: Diagnosis not present

## 2015-11-06 DIAGNOSIS — K219 Gastro-esophageal reflux disease without esophagitis: Secondary | ICD-10-CM | POA: Insufficient documentation

## 2015-11-06 DIAGNOSIS — E119 Type 2 diabetes mellitus without complications: Secondary | ICD-10-CM | POA: Insufficient documentation

## 2015-11-06 DIAGNOSIS — I6523 Occlusion and stenosis of bilateral carotid arteries: Secondary | ICD-10-CM

## 2015-11-06 DIAGNOSIS — I10 Essential (primary) hypertension: Secondary | ICD-10-CM | POA: Insufficient documentation

## 2015-11-06 DIAGNOSIS — C50412 Malignant neoplasm of upper-outer quadrant of left female breast: Secondary | ICD-10-CM

## 2015-11-06 HISTORY — DX: Malignant neoplasm of upper-outer quadrant of left female breast: C50.412

## 2015-11-06 NOTE — Telephone Encounter (Signed)
Confirmed [facility] for 11/13/15 at 1215 .  Instructions and contact information given.

## 2015-11-06 NOTE — Telephone Encounter (Signed)
Left vm for pt to return call concerning [place] on 6/21. Contact information provided.

## 2015-11-13 ENCOUNTER — Encounter: Payer: Self-pay | Admitting: General Practice

## 2015-11-13 ENCOUNTER — Ambulatory Visit: Payer: Medicare Other | Admitting: Physical Therapy

## 2015-11-13 ENCOUNTER — Ambulatory Visit
Admission: RE | Admit: 2015-11-13 | Discharge: 2015-11-13 | Disposition: A | Discharge status: Home / Self Care | Payer: Medicare Other | Source: Ambulatory Visit | Attending: Radiation Oncology | Admitting: Radiation Oncology

## 2015-11-13 ENCOUNTER — Encounter: Payer: Self-pay | Admitting: Hematology and Oncology

## 2015-11-13 ENCOUNTER — Other Ambulatory Visit (HOSPITAL_BASED_OUTPATIENT_CLINIC_OR_DEPARTMENT_OTHER): Payer: Medicare Other

## 2015-11-13 ENCOUNTER — Ambulatory Visit (HOSPITAL_BASED_OUTPATIENT_CLINIC_OR_DEPARTMENT_OTHER): Payer: Medicare Other | Admitting: Hematology and Oncology

## 2015-11-13 VITALS — BP 147/52 | HR 75 | Temp 97.8°F | Resp 18 | Ht 64.0 in | Wt 149.5 lb

## 2015-11-13 DIAGNOSIS — C50412 Malignant neoplasm of upper-outer quadrant of left female breast: Secondary | ICD-10-CM

## 2015-11-13 DIAGNOSIS — Z170 Estrogen receptor positive status [ER+]: Secondary | ICD-10-CM | POA: Diagnosis not present

## 2015-11-13 LAB — COMPREHENSIVE METABOLIC PANEL
ALT: 31 U/L (ref 0–55)
ANION GAP: 12 meq/L — AB (ref 3–11)
AST: 24 U/L (ref 5–34)
Albumin: 3.5 g/dL (ref 3.5–5.0)
Alkaline Phosphatase: 66 U/L (ref 40–150)
BUN: 22 mg/dL (ref 7.0–26.0)
CO2: 23 mEq/L (ref 22–29)
Calcium: 9.3 mg/dL (ref 8.4–10.4)
Chloride: 96 mEq/L — ABNORMAL LOW (ref 98–109)
Creatinine: 1.2 mg/dL — ABNORMAL HIGH (ref 0.6–1.1)
EGFR: 41 mL/min/{1.73_m2} — AB (ref >90)
GLUCOSE: 121 mg/dL (ref 70–140)
Potassium: 4.9 mEq/L (ref 3.5–5.1)
Sodium: 130 mEq/L — ABNORMAL LOW (ref 136–145)
TOTAL PROTEIN: 7.9 g/dL (ref 6.4–8.3)

## 2015-11-13 LAB — CBC WITH DIFFERENTIAL/PLATELET
BASO%: 0.6 % (ref 0.0–2.0)
Basophils Absolute: 0 10*3/uL (ref 0.0–0.1)
EOS ABS: 0 10*3/uL (ref 0.0–0.5)
EOS%: 0.6 % (ref 0.0–7.0)
HEMATOCRIT: 35.1 % (ref 34.8–46.6)
HEMOGLOBIN: 11.6 g/dL (ref 11.6–15.9)
LYMPH%: 12.3 % — ABNORMAL LOW (ref 14.0–49.7)
MCH: 29.4 pg (ref 25.1–34.0)
MCHC: 33.1 g/dL (ref 31.5–36.0)
MCV: 88.8 fL (ref 79.5–101.0)
MONO#: 0.7 10*3/uL (ref 0.1–0.9)
MONO%: 10.8 % (ref 0.0–14.0)
NEUT%: 75.7 % (ref 38.4–76.8)
NEUTROS ABS: 4.8 10*3/uL (ref 1.5–6.5)
PLATELETS: 191 10*3/uL (ref 145–400)
RBC: 3.95 10*6/uL (ref 3.70–5.45)
RDW: 14.3 % (ref 11.2–14.5)
WBC: 6.3 10*3/uL (ref 3.9–10.3)
lymph#: 0.8 10*3/uL — ABNORMAL LOW (ref 0.9–3.3)

## 2015-11-13 NOTE — Progress Notes (Signed)
[place] CONSULT NOTE  Patient Care Team: Derinda Late, MD as PCP - General (Family Medicine) [name], MD as Consulting Physician (General Surgery) Nicholas Lose, MD as Consulting Physician (Hematology and Oncology) Gery Pray, MD as Consulting Physician (Radiation Oncology)  CHIEF COMPLAINTS/PURPOSE OF CONSULTATION:  Newly diagnosed breast cancer  HISTORY OF PRESENTING ILLNESS:  Rhonda [name] 81 y.o. female is here because of recent diagnosis of left breast cancer. Patient had a routine screening mammogram that revealed left breast mass measuring 1.6 cm. Axilla was negative. There were also right breast calcifications which on biopsy with fibroadenoma. Ultrasound-guided biopsy of the left breast mass revealed grade 1 invasive lobular cancer that is ER positive PR negative HER-2 negative with a Ki-67 of 5%. She was presented at the multidisciplinary tumor board and she is here today to discuss a treatment plan accompanied by her family. Patient's husband reports that she does have chronic fatigue as well as restless leg syndrome.  I reviewed her records extensively and collaborated the history with the patient.  SUMMARY OF ONCOLOGIC HISTORY:   Breast cancer of upper-outer quadrant of left female breast ([place])   11/04/2015 Initial Diagnosis Left breast biopsy 2:00 position: Invasive lobular cancer grade 1, ER 80%, PR 0%, Ki-67 5%, HER-2 negative ratio 1.19, screening detected left breast mass 1.6 cm, T1 cN0 stage IA clinical stage   MEDICAL HISTORY:  Past Medical History  Diagnosis Date  . Diabetes mellitus without complication ([place])   . Hypertension   . Pneumonia   . GERD (gastroesophageal reflux disease)   . Hyperlipidemia   . Breast cancer of upper-outer quadrant of left female breast ([place]) 11/06/2015    SURGICAL HISTORY: History reviewed. No pertinent past surgical history.  SOCIAL HISTORY: Social History   Social History  . Marital Status:  Married    Spouse Name: N/A  . Number of Children: N/A  . Years of Education: N/A   Occupational History  . Not on file.   Social History Main Topics  . Smoking status: Never Smoker   . Smokeless tobacco: Not on file  . Alcohol Use: No  . Drug Use: No  . Sexual Activity: Not on file   Other Topics Concern  . Not on file   Social History Narrative    FAMILY HISTORY: History reviewed. No pertinent family history.  ALLERGIES:  is allergic to codeine.  MEDICATIONS:  Current Outpatient Prescriptions  Medication Sig Dispense Refill  . aspirin 81 MG tablet Take 81 mg by mouth daily.    . Calcium Carbonate-Vitamin D (CALTRATE 600+D PO) Take 2 tablets by mouth daily.    Marland Kitchen losartan (COZAAR) 50 MG tablet Take 50 mg by mouth daily.    . metFORMIN (GLUCOPHAGE-XR) 500 MG 24 hr tablet Take 1,000 mg by mouth 2 (two) times daily.    . Multiple Vitamins-Minerals (MULTIVITAMIN & MINERAL PO) Take 1 tablet by mouth every morning.    Marland Kitchen omeprazole (PRILOSEC) 20 MG capsule     . pravastatin (PRAVACHOL) 80 MG tablet Take 80 mg by mouth at bedtime.    . primidone (MYSOLINE) 250 MG tablet     . rOPINIRole (REQUIP) 1 MG tablet Take 1.5 mg by mouth at bedtime.    . sitaGLIPtin (JANUVIA) 100 MG tablet Take 100 mg by mouth daily.     No current facility-administered medications for this visit.    REVIEW OF SYSTEMS:   Constitutional: Denies fevers, chills or abnormal night sweats Eyes: Denies blurriness of vision, double  vision or watery eyes Ears, nose, mouth, throat, and face: Denies mucositis or sore throat Respiratory: Denies cough, dyspnea or wheezes Cardiovascular: Denies palpitation, chest discomfort or lower extremity swelling Gastrointestinal:  Denies nausea, heartburn or change in bowel habits Skin: Denies abnormal skin rashes Lymphatics: Denies new lymphadenopathy or easy bruising Neurological:Denies numbness, tingling or new weaknesses, Restless legs and fatigue Behavioral/Psych:  Mood is stable, no new changes  Breast:  Denies any palpable lumps or discharge All other systems were reviewed with the patient and are negative.  PHYSICAL EXAMINATION: ECOG PERFORMANCE STATUS: 1 - Symptomatic but completely ambulatory  Filed Vitals:   11/13/15 1249  BP: 147/52  Pulse: 75  Temp: 97.8 F (36.6 C)  Resp: 18   Filed Weights   11/13/15 1249  Weight: 149 lb 8 oz (67.813 kg)    GENERAL:alert, no distress and comfortable SKIN: skin color, texture, turgor are normal, no rashes or significant lesions EYES: normal, conjunctiva are pink and non-injected, sclera clear OROPHARYNX:no exudate, no erythema and lips, buccal mucosa, and tongue normal  NECK: supple, thyroid normal size, non-tender, without nodularity LYMPH:  no palpable lymphadenopathy in the cervical, axillary or inguinal LUNGS: clear to auscultation and percussion with normal breathing effort HEART: regular rate & rhythm and no murmurs and no lower extremity edema ABDOMEN:abdomen soft, non-tender and normal bowel sounds Musculoskeletal:no cyanosis of digits and no clubbing  PSYCH: alert & oriented x 3 with fluent speech NEURO: no focal motor/sensory deficits BREAST: No palpable nodules in breast. No palpable axillary or supraclavicular lymphadenopathy (exam performed in the presence of a chaperone)   LABORATORY DATA:  I have reviewed the data as listed Lab Results  Component Value Date   WBC 6.3 11/13/2015   HGB 11.6 11/13/2015   HCT 35.1 11/13/2015   MCV 88.8 11/13/2015   PLT 191 11/13/2015   Lab Results  Component Value Date   NA 130* 11/13/2015   K 4.9 11/13/2015   CL 107 09/27/2014   CO2 23 11/13/2015    RADIOGRAPHIC STUDIES: I have personally reviewed the radiological reports and agreed with the findings in the report.  ASSESSMENT AND PLAN:  Breast cancer of upper-outer quadrant of left female breast ([place]) 11/04/2015 Left breast biopsy 2:00 position: Invasive lobular cancer grade 1, ER  80%, PR 0%, Ki-67 5%, HER-2 negative ratio 1.19, screening detected left breast mass 1.6 cm, T1 cN0 stage IA clinical stage  Pathology and radiology counseling:Discussed with the patient, the details of pathology including the type of breast cancer,the clinical staging, the significance of ER, PR and HER-2/neu receptors and the implications for treatment. After reviewing the pathology in detail, we proceeded to discuss the different treatment options between surgery, radiation, and antiestrogen therapies.  Recommendations: 1. Breast conserving surgery followed by 2. +/- Adjuvant radiation therapy followed by 3. Adjuvant antiestrogen therapy Return to clinic after surgery to discuss final pathology report    All questions were answered. The patient knows to call the clinic with any problems, questions or concerns.    Rulon Eisenmenger, MD 11/13/2015

## 2015-11-13 NOTE — Progress Notes (Signed)
Met with patient and family at Breast Multidisciplinary clinic to introduce support center team/resources, reviewing distress screen per protocol.  The patient scored a 4 on the Psychosocial Distress Thermometer which indicates moderate distress.  Also assessed for distress and other psychosocial needs.  ONCBCN DISTRESS SCREENING 11/13/2015  Screening Type Initial Screening  Distress experienced in past week (1-10) 4  Emotional problem type Nervousness/Anxiety;Adjusting to illness  Information Concerns Type Lack of info about diagnosis;Lack of info about treatment   Counselor used open question to assess how client is doing with new diagnosis. Counselor used reflection of content regarding client's stress and anxiety, and explored support the client has. Client reported having strong family support and her faith being an integral coping mechanism for her. Counselor used open question to check in with the rest of the family, and provided active listening and reflection of content and feelings. Client reported feeling less stressed after gaining more information about her diagnosis, and meeting with all the doctors.    Follow up needed:  [N]  Pt is aware of ongoing Support Team availability and contact information. Please also call as needs arise/circumstances change.  Thank you.  Wendall Papa, MS, [place], [facility] Counseling Intern-Department for Spiritual Care and [place], [place], [place]

## 2015-11-13 NOTE — Progress Notes (Addendum)
Radiation Oncology         [phone] ________________________________  Initial Outpatient Consultation  Name: Rhonda [name] MRN: [id number]  Date: 11/13/2015  DOB: 11-23-1932  AJ:OINOMVEH,MCNOB F, MD  Derinda Late, MD   REFERRING PHYSICIAN: Derinda Late, MD  DIAGNOSIS: The encounter diagnosis was Breast cancer of upper-outer quadrant of left female breast ([place]).  Clinical stage I invasive lobular carcinoma of the left breast  HISTORY OF PRESENT ILLNESS::Rhonda [name] is a 82 y.o. female who had a screening mammogram at [facility] on 0/9/62. This noted a 1 cm area of grouped amorphous heterogeneous calcifications in the UIQ of the right breast posterior depth and a possible mass in the UIQ left breast posterior depth. A diagnostic bilateral mammogram was performed on 10/28/15. The left breast density visible on the initial mammogram was no longer present. However, a new group of calcifications was noted in the 1 o'clock position in the right breast and a new architectural distortion in the UOQ of the left breast was visualized. Ultrasound the same day revealed a 1.6 cm irregular mass in the UOQ of the left breast.  Bilateral breast biopsies were conducted on 11/04/15. Biopsy of the 1 o'clock position of the right breast revealed hyalinized fibroadenoma with calcifications and no evidence of malignancy. Biopsy of the 2 o'clock position of the left breast revealed grade 1 invasive mammary carcinoma (lobular)(ER 80% positive, PR 0% negative, HER2 negative, Ki67 5%).  The patient presents to multidisciplinary breast clinic to discuss the role of radiation in the management of her disease.  PREVIOUS RADIATION THERAPY: No  PAST MEDICAL HISTORY:  has a past medical history of Diabetes mellitus without complication ([place]); Hypertension; Pneumonia; GERD (gastroesophageal reflux disease); Hyperlipidemia; and Breast cancer of upper-outer quadrant of left female breast ([place]) (11/06/2015).     PAST SURGICAL HISTORY:No past surgical history on file.  FAMILY HISTORY: family history is not on file.  SOCIAL HISTORY:  reports that she has never smoked. She does not have any smokeless tobacco history on file. She reports that she does not drink alcohol or use illicit drugs.  ALLERGIES: Codeine  MEDICATIONS:  Current Outpatient Prescriptions  Medication Sig Dispense Refill  . aspirin 81 MG tablet Take 81 mg by mouth daily.    . Calcium Carbonate-Vitamin D (CALTRATE 600+D PO) Take 2 tablets by mouth daily.    Marland Kitchen losartan (COZAAR) 50 MG tablet Take 50 mg by mouth daily.    . metFORMIN (GLUCOPHAGE-XR) 500 MG 24 hr tablet Take 1,000 mg by mouth 2 (two) times daily.    . Multiple Vitamins-Minerals (MULTIVITAMIN & MINERAL PO) Take 1 tablet by mouth every morning.    Marland Kitchen omeprazole (PRILOSEC) 20 MG capsule     . pravastatin (PRAVACHOL) 80 MG tablet Take 80 mg by mouth at bedtime.    . primidone (MYSOLINE) 250 MG tablet     . rOPINIRole (REQUIP) 1 MG tablet Take 1.5 mg by mouth at bedtime.    . sitaGLIPtin (JANUVIA) 100 MG tablet Take 100 mg by mouth daily.     No current facility-administered medications for this encounter.    REVIEW OF SYSTEMS:  A 15 point review of systems is documented in the electronic medical record. This was obtained by the nursing staff. However, I reviewed this with the patient to discuss relevant findings and make appropriate changes.  Pertinent items noted in HPI and remainder of comprehensive ROS otherwise negative.   The patient wears glasses and hearing aids. She complains of heartburn. No  reports of breast pain nipple discharge or bleeding prior to diagnosis  Gynecologic History  Age at first menstrual period? 14  Are you still having periods? No  If you no longer have periods: Have you used hormone replacement? Yes  If YES, for how long? 5 years Obstetric History:  How many children have you carried to term? 4 Your age at first live birth?  28  Pregnant now or trying to get pregnant? no  Have you used birth control pills or hormone shots for contraception? No Health Maintenance:  Have you ever had a colonoscopy? Yes If yes, date? About 4-5 years ago  Have you ever had a bone density? Yes If yes, date? October 24, 2015  Date of your last PAP smear? 4 years ago   PHYSICAL EXAM:  vitals were not taken for this visit.  Vitals with BMI 11/13/2015  Height _0   Weight 149 lbs 8 oz  BMI 03.0  Systolic 092  Diastolic 52  Pulse 75  Respirations 18   Lungs are clear to auscultation bilaterally. Heart has regular rate and rhythm. No palpable cervical, supraclavicular, or axillary adenopathy. Biopsy site in the upper outer aspect of the left breast. Some thickening throughout both breasts. Somewhat dense, but no dominant mass appreciated or nipple discharge or bleeding. Biopsy site of the right breast with no bleeding or bruising.  ECOG = 1  LABORATORY DATA:  Lab Results  Component Value Date   WBC 6.3 11/13/2015   HGB 11.6 11/13/2015   HCT 35.1 11/13/2015   MCV 88.8 11/13/2015   PLT 191 11/13/2015   NEUTROABS 4.8 11/13/2015   Lab Results  Component Value Date   NA 130* 11/13/2015   K 4.9 11/13/2015   CL 107 09/27/2014   CO2 23 11/13/2015   GLUCOSE 121 11/13/2015   CREATININE 1.2* 11/13/2015   CALCIUM 9.3 11/13/2015      RADIOGRAPHY: No results found. Reports and imaging studies from [facility] reviewed    IMPRESSION: Clinical stage I invasive lobular carcinoma of the left breast I spoke to the patient today regarding her diagnosis and options for treatment. We discussed the equivalence in terms of survival and local failure between mastectomy and breast conservation. We discussed the role of radiation in decreasing local failures in patients who undergo lumpectomy. We discussed the process of CT simulation and the placement tattoos. We discussed 4-6 weeks of treatment as an outpatient. We discussed the possibility of  asymptomatic lung damage. We discussed the low likelihood of secondary malignancies. We discussed the possible side effects including but not limited to skin redness, fatigue, permanent skin darkening, and breast swelling.  We discussed the use of cardiac sparing with deep inspiration breath hold if needed.  PLAN: The patient will undergo a bilateral breast MRI for further evaluation given the lobular histology. She will then undergo a left lumpectomy. She will follow up with me to further discuss radiation treatment options.  Dr. Lindi Adie to recommend adjuvant antiestrogen therapy.     ------------------------------------------------  Blair Promise, PhD, MD  This document serves as a record of services personally performed by Gery Pray, MD. It was created on his behalf by Darcus Austin, a trained medical scribe. The creation of this record is based on the scribe's personal observations and the provider's statements to them. This document has been checked and approved by the attending provider.

## 2015-11-13 NOTE — Assessment & Plan Note (Signed)
11/04/2015 Left breast biopsy 2:00 position: Invasive lobular cancer grade 1, ER 80%, PR 0%, Ki-67 5%, HER-2 negative ratio 1.19, screening detected left breast mass 1.6 cm, T1 cN0 stage IA clinical stage  Pathology and radiology counseling:Discussed with the patient, the details of pathology including the type of breast cancer,the clinical staging, the significance of ER, PR and HER-2/neu receptors and the implications for treatment. After reviewing the pathology in detail, we proceeded to discuss the different treatment options between surgery, radiation, and antiestrogen therapies.  Recommendations: 1. Breast conserving surgery followed by 2. +/- Adjuvant radiation therapy followed by 3. Adjuvant antiestrogen therapy Return to clinic after surgery to discuss final pathology report

## 2015-11-14 ENCOUNTER — Ambulatory Visit
Admission: RE | Admit: 2015-11-14 | Discharge: 2015-11-14 | Disposition: A | Discharge status: Home / Self Care | Payer: Medicare Other | Source: Ambulatory Visit | Attending: General Surgery | Admitting: General Surgery

## 2015-11-14 ENCOUNTER — Other Ambulatory Visit: Payer: Medicare Other

## 2015-11-14 DIAGNOSIS — C50412 Malignant neoplasm of upper-outer quadrant of left female breast: Secondary | ICD-10-CM

## 2015-11-14 MED ORDER — GADOBENATE DIMEGLUMINE 529 MG/ML IV SOLN
13.0000 mL | Freq: Once | INTRAVENOUS | Status: AC | PRN
  Administered 2015-11-14: 7 mL via INTRAVENOUS

## 2015-11-18 ENCOUNTER — Other Ambulatory Visit: Payer: Self-pay | Admitting: General Surgery

## 2015-11-18 ENCOUNTER — Telehealth: Payer: Self-pay | Admitting: *Deleted

## 2015-11-18 DIAGNOSIS — C50412 Malignant neoplasm of upper-outer quadrant of left female breast: Secondary | ICD-10-CM

## 2015-11-18 NOTE — Telephone Encounter (Signed)
Left message to follow up from [facility] 11/13/15. 

## 2015-11-28 ENCOUNTER — Other Ambulatory Visit: Payer: Self-pay | Admitting: General Surgery

## 2015-11-28 DIAGNOSIS — C50412 Malignant neoplasm of upper-outer quadrant of left female breast: Secondary | ICD-10-CM

## 2015-11-29 ENCOUNTER — Encounter: Payer: Self-pay | Admitting: *Deleted

## 2015-11-29 ENCOUNTER — Telehealth: Payer: Self-pay | Admitting: Hematology and Oncology

## 2015-11-29 NOTE — Telephone Encounter (Signed)
s.w. pt and advised on Aug appt....pt ok and aware °

## 2015-12-03 ENCOUNTER — Encounter (HOSPITAL_BASED_OUTPATIENT_CLINIC_OR_DEPARTMENT_OTHER): Payer: Self-pay | Admitting: *Deleted

## 2015-12-12 NOTE — H&P (Signed)
Rhonda [name]  Location: [facility] Patient #: [id number] DOB: [birth year] Undefined / Language: Rhonda [name] / Race: White Female       History of Present Illness  The patient is a 82 year old female who presents with breast cancer. This is a pleasant 82 year old Caucasian female, referred by Dr. Emmit Pomfret at [facility] mammography for evaluation and management of invasive lobular carcinoma of the left breast, upper outer quadrant. She is being seen in the [facility] today by Dr. Lindi [name], Dr. Sondra [name], and me. Dr. Derinda Late is her PCP.  She has no prior history of any significant breast problems other than cyst aspiration. She gets annual mammograms. Recent mammograms showed a small mass in the right breast which was biopsied and showed a benign fibroadenoma. There was also a 1.6 cm mass in the left breast upper outer quadrant which was biopsied and shows invasive lobular carcinoma, estrogen receptor 80%, progesterone receptors 0, Ki-67 5%, HER-2 negative. Ultrasound of the axilla is negative. We are planning bilateral breast MRI because this is a lobular carcinoma. Clinically this is a T1c, N0 cancer.  Comorbidities include type 2 diabetes, hypertension, GERD, history of a TIA or stroke with some ataxia requiring a cane when she walks in the yard. She does drive a car some and is independent but family states she is a little less steady on her feet than she has been. No cognitive problems. She's had some orthopedic injuries and fractures. She's had tubal ligation and cataract surgery  Family history is negative for any cancer syndromes. Specifically no breast or ovarian cancer. Mother died of some type of neurologic disease multiple sclerosis or ALS. Father died of congestive heart failure.  She lives in [place]. She's married and her husband and one of her children are here today with her. She does have 4 children. She quit smoking tobacco at age 54. Drinks occasional  wine.  We had a long talk about management of her left breast cancer. I told her that if this is a small solitary finding that she has the option of lumpectomy with radioactive seed localization and we compared that to mastectomy. Dr. Lindi [name] does not plan chemotherapy and does not require any lymph node dissection. He will offer antiestrogen hormone therapy. Dr. Sondra [name] may or may not offer radiation therapy depending on final pathology. I told her there was no survival advantage to mastectomy and we're leaning toward lumpectomy.  Current plan is to proceed with bilateral breast MRI which has been scheduled. My office will set up an appointment to see me as soon as possible after the MRI. We will discuss and decide definitive surgical management at that time.   Other Problems  Diabetes Mellitus Gastroesophageal Reflux Disease High blood pressure Hypercholesterolemia  Past Surgical History  Breast Biopsy Bilateral.  Diagnostic Studies History  Colonoscopy 1-5 years ago Mammogram within last year Pap Smear >5 years ago  Medication History Medications Reconciled  Social History  Alcohol use Occasional alcohol use. No caffeine use No drug use Tobacco use Former smoker.  Family History  Diabetes Mellitus Brother, Family Members In [place], Sister. Heart Disease Father. Hypertension Brother, Daughter, Sister.  Pregnancy / Birth History ( Age at menarche 64 years. Age of menopause 56-50 Gravida 24 Maternal age 64-25 Para 4    Review of Systems  General Not Present- Appetite Loss, Chills, Fatigue, Fever, Night Sweats, Weight Gain and Weight Loss. Skin Not Present- Change in Wart/Mole, Dryness, Hives, Jaundice, New Lesions, Non-Healing Wounds, Rash and Ulcer.  HEENT Present- Hearing Loss and Wears glasses/contact lenses. Not Present- Earache, Hoarseness, Nose Bleed, Oral Ulcers, Ringing in the Ears, Seasonal Allergies, Sinus Pain, Sore Throat, Visual  Disturbances and Yellow Eyes. Respiratory Not Present- Bloody sputum, Chronic Cough, Difficulty Breathing, Snoring and Wheezing. Breast Not Present- Breast Mass, Breast Pain, Nipple Discharge and Skin Changes. Cardiovascular Not Present- Chest Pain, Difficulty Breathing Lying Down, Leg Cramps, Palpitations, Rapid Heart Rate, Shortness of Breath and Swelling of Extremities. Gastrointestinal Not Present- Abdominal Pain, Bloating, Bloody Stool, Change in Bowel Habits, Chronic diarrhea, Constipation, Difficulty Swallowing, Excessive gas, Gets full quickly at meals, Hemorrhoids, Indigestion, Nausea, Rectal Pain and Vomiting. Female Genitourinary Not Present- Frequency, Nocturia, Painful Urination, Pelvic Pain and Urgency. Musculoskeletal Not Present- Back Pain, Joint Pain, Joint Stiffness, Muscle Pain, Muscle Weakness and Swelling of Extremities. Neurological Present- Tremor. Not Present- Decreased Memory, Fainting, Headaches, Numbness, Seizures, Tingling, Trouble walking and Weakness. Psychiatric Not Present- Anxiety, Bipolar, Change in Sleep Pattern, Depression, Fearful and Frequent crying. Endocrine Not Present- Cold Intolerance, Excessive Hunger, Hair Changes, Heat Intolerance, Hot flashes and New Diabetes. Hematology Not Present- Blood Thinners, Easy Bruising, Excessive bleeding, Gland problems, HIV and Persistent Infections.   Physical Exam  General Mental Status-Alert. General Appearance-Consistent with stated age. Hydration-Well hydrated. Voice-Normal.  Head and Neck Head-normocephalic, atraumatic with no lesions or palpable masses. Trachea-midline. Thyroid Gland Characteristics - normal size and consistency.  Eye Eyeball - Bilateral-Extraocular movements intact. Sclera/Conjunctiva - Bilateral-No scleral icterus.  Chest and Lung Exam Chest and lung exam reveals -quiet, even and easy respiratory effort with no use of accessory muscles and on auscultation, normal  breath sounds, no adventitious sounds and normal vocal resonance. Inspection Chest Wall - Normal. Back - normal.  Breast Note: Moderately large breast. Small hematoma upper outer quadrant creating slight dense mass effect. No other mass in either breast. No axillary adenopathy.   Cardiovascular Cardiovascular examination reveals -normal heart sounds, regular rate and rhythm with no murmurs and normal pedal pulses bilaterally.  Abdomen Inspection Inspection of the abdomen reveals - No Hernias. Skin - Scar - Note: Small lower midline scar from tubal ligation. Palpation/Percussion Palpation and Percussion of the abdomen reveal - Soft, Non Tender, No Rebound tenderness, No Rigidity (guarding) and No hepatosplenomegaly. Auscultation Auscultation of the abdomen reveals - Bowel sounds normal.  Neurologic Neurologic evaluation reveals -alert and oriented x 3 with no impairment of recent or remote memory. Mental Status-Normal.  Musculoskeletal Normal Exam - Left-Upper Extremity Strength Normal and Lower Extremity Strength Normal. Normal Exam - Right-Upper Extremity Strength Normal and Lower Extremity Strength Normal.  Lymphatic Head & Neck  General Head & Neck Lymphatics: Bilateral - Description - Normal. Axillary  General Axillary Region: Bilateral - Description - Normal. Tenderness - Non Tender. Femoral & Inguinal  Generalized Femoral & Inguinal Lymphatics: Bilateral - Description - Normal. Tenderness - Non Tender.    Assessment & Plan  PRIMARY CANCER OF UPPER OUTER QUADRANT OF LEFT BREAST (C50.412)   Your recent imaging studies and biopsy show a small invasive lobular carcinoma of the left breast, upper outer quadrant. Ultrasound of your axilla is negative. Imaging studies described this as 1.6 cm in diameter. Further studies show this is estrogen receptor positive, progesterone receptor negative, HER-2 negative. Hopefully this is a stage I cancer.  Because this  is a lobular cancer, you'll be scheduled for a breast MRI to get a better evaluation of the extent of disease.  Return to see Dr. [name] shortly after the MRI is performed to discuss final surgical  treatment plans  Our tentative plan is a left breast lumpectomy with radioactive seed localization We discussed the indications, techniques, and risk of the surgery in detail. There are no plans for chemotherapy We do not plan to remove any lymph nodes He will likely be offered antiestrogen hormone therapy You may or may not require radiation therapy. That will be decided later  CVA, OLD, ATAXIA (I69.993) HYPERLIPIDEMIA, MILD (E78.5) HYPERTENSION, BENIGN (I10) TYPE 2 DIABETES MELLITUS TREATED WITHOUT INSULIN (E11.9)    Ascher Schroepfer M. [name], M.D., [facility] General and Minimally invasive Surgery Breast and Colorectal Surgery Office:   [phone] Pager:   [phone]

## 2015-12-16 ENCOUNTER — Encounter (HOSPITAL_BASED_OUTPATIENT_CLINIC_OR_DEPARTMENT_OTHER)
Admission: RE | Admit: 2015-12-16 | Discharge: 2015-12-16 | Disposition: A | Discharge status: Home / Self Care | Payer: Medicare Other | Source: Ambulatory Visit | Attending: General Surgery | Admitting: General Surgery

## 2015-12-16 DIAGNOSIS — Z7984 Long term (current) use of oral hypoglycemic drugs: Secondary | ICD-10-CM | POA: Diagnosis not present

## 2015-12-16 DIAGNOSIS — I10 Essential (primary) hypertension: Secondary | ICD-10-CM | POA: Diagnosis not present

## 2015-12-16 DIAGNOSIS — Z170 Estrogen receptor positive status [ER+]: Secondary | ICD-10-CM | POA: Diagnosis not present

## 2015-12-16 DIAGNOSIS — C50412 Malignant neoplasm of upper-outer quadrant of left female breast: Secondary | ICD-10-CM | POA: Diagnosis not present

## 2015-12-16 DIAGNOSIS — I69393 Ataxia following cerebral infarction: Secondary | ICD-10-CM | POA: Diagnosis not present

## 2015-12-16 DIAGNOSIS — K219 Gastro-esophageal reflux disease without esophagitis: Secondary | ICD-10-CM | POA: Diagnosis not present

## 2015-12-16 DIAGNOSIS — E7800 Pure hypercholesterolemia, unspecified: Secondary | ICD-10-CM | POA: Diagnosis not present

## 2015-12-16 DIAGNOSIS — E119 Type 2 diabetes mellitus without complications: Secondary | ICD-10-CM | POA: Diagnosis not present

## 2015-12-16 DIAGNOSIS — E785 Hyperlipidemia, unspecified: Secondary | ICD-10-CM | POA: Diagnosis not present

## 2015-12-16 DIAGNOSIS — Z87891 Personal history of nicotine dependence: Secondary | ICD-10-CM | POA: Diagnosis not present

## 2015-12-16 DIAGNOSIS — Z79899 Other long term (current) drug therapy: Secondary | ICD-10-CM | POA: Diagnosis not present

## 2015-12-16 LAB — BASIC METABOLIC PANEL
Anion gap: 8 (ref 5–15)
BUN: 23 mg/dL — AB (ref 6–20)
CO2: 28 mmol/L (ref 22–32)
CREATININE: 1.03 mg/dL — AB (ref 0.44–1.00)
Calcium: 9.2 mg/dL (ref 8.9–10.3)
Chloride: 98 mmol/L — ABNORMAL LOW (ref 101–111)
GFR, EST AFRICAN AMERICAN: 58 mL/min — AB (ref >60)
GFR, EST NON AFRICAN AMERICAN: 50 mL/min — AB (ref >60)
GLUCOSE: 137 mg/dL — AB (ref 65–99)
Potassium: 4.7 mmol/L (ref 3.5–5.1)
Sodium: 134 mmol/L — ABNORMAL LOW (ref 135–145)

## 2015-12-17 ENCOUNTER — Ambulatory Visit (HOSPITAL_BASED_OUTPATIENT_CLINIC_OR_DEPARTMENT_OTHER)
Admission: RE | Admit: 2015-12-17 | Discharge: 2015-12-17 | Disposition: A | Discharge status: Home / Self Care | Payer: Medicare Other | Source: Ambulatory Visit | Attending: General Surgery | Admitting: General Surgery

## 2015-12-17 ENCOUNTER — Ambulatory Visit (HOSPITAL_BASED_OUTPATIENT_CLINIC_OR_DEPARTMENT_OTHER): Payer: Medicare Other | Admitting: Anesthesiology

## 2015-12-17 ENCOUNTER — Encounter (HOSPITAL_BASED_OUTPATIENT_CLINIC_OR_DEPARTMENT_OTHER): Payer: Self-pay | Admitting: *Deleted

## 2015-12-17 ENCOUNTER — Encounter (HOSPITAL_BASED_OUTPATIENT_CLINIC_OR_DEPARTMENT_OTHER)
Admission: RE | Disposition: A | Discharge status: Home / Self Care | Payer: Self-pay | Source: Ambulatory Visit | Attending: General Surgery

## 2015-12-17 DIAGNOSIS — Z79899 Other long term (current) drug therapy: Secondary | ICD-10-CM | POA: Insufficient documentation

## 2015-12-17 DIAGNOSIS — K219 Gastro-esophageal reflux disease without esophagitis: Secondary | ICD-10-CM | POA: Insufficient documentation

## 2015-12-17 DIAGNOSIS — E119 Type 2 diabetes mellitus without complications: Secondary | ICD-10-CM | POA: Insufficient documentation

## 2015-12-17 DIAGNOSIS — E7800 Pure hypercholesterolemia, unspecified: Secondary | ICD-10-CM | POA: Insufficient documentation

## 2015-12-17 DIAGNOSIS — C50412 Malignant neoplasm of upper-outer quadrant of left female breast: Secondary | ICD-10-CM | POA: Insufficient documentation

## 2015-12-17 DIAGNOSIS — I10 Essential (primary) hypertension: Secondary | ICD-10-CM | POA: Insufficient documentation

## 2015-12-17 DIAGNOSIS — Z170 Estrogen receptor positive status [ER+]: Secondary | ICD-10-CM | POA: Insufficient documentation

## 2015-12-17 DIAGNOSIS — E785 Hyperlipidemia, unspecified: Secondary | ICD-10-CM | POA: Insufficient documentation

## 2015-12-17 DIAGNOSIS — Z7984 Long term (current) use of oral hypoglycemic drugs: Secondary | ICD-10-CM | POA: Insufficient documentation

## 2015-12-17 DIAGNOSIS — I69393 Ataxia following cerebral infarction: Secondary | ICD-10-CM | POA: Insufficient documentation

## 2015-12-17 DIAGNOSIS — Z87891 Personal history of nicotine dependence: Secondary | ICD-10-CM | POA: Insufficient documentation

## 2015-12-17 HISTORY — DX: Essential tremor: G25.0

## 2015-12-17 HISTORY — DX: Unspecified osteoarthritis, unspecified site: M19.90

## 2015-12-17 HISTORY — PX: BREAST LUMPECTOMY WITH RADIOACTIVE SEED LOCALIZATION: SHX6424

## 2015-12-17 LAB — GLUCOSE, CAPILLARY
GLUCOSE-CAPILLARY: 86 mg/dL (ref 65–99)
GLUCOSE-CAPILLARY: 115 mg/dL — AB (ref 65–99)

## 2015-12-17 SURGERY — BREAST LUMPECTOMY WITH RADIOACTIVE SEED LOCALIZATION
Anesthesia: General | Site: Breast | Laterality: Left

## 2015-12-17 MED ORDER — CHLORHEXIDINE GLUCONATE CLOTH 2 % EX PADS: 6.0000 | MEDICATED_PAD | Freq: Once | CUTANEOUS | Status: DC

## 2015-12-17 MED ORDER — CEFAZOLIN SODIUM-DEXTROSE 2-4 GM/100ML-% IV SOLN
INTRAVENOUS | Status: AC
  Filled 2015-12-17: qty 100

## 2015-12-17 MED ORDER — LACTATED RINGERS IV SOLN
INTRAVENOUS | Status: DC
  Administered 2015-12-17: 10 mL/h via INTRAVENOUS

## 2015-12-17 MED ORDER — GLYCOPYRROLATE 0.2 MG/ML IV SOSY
PREFILLED_SYRINGE | INTRAVENOUS | Status: DC | PRN
  Administered 2015-12-17: .2 mg via INTRAVENOUS

## 2015-12-17 MED ORDER — BUPIVACAINE-EPINEPHRINE (PF) 0.5% -1:200000 IJ SOLN
INTRAMUSCULAR | Status: AC
  Filled 2015-12-17: qty 30

## 2015-12-17 MED ORDER — MIDAZOLAM HCL 2 MG/2ML IJ SOLN: 1.0000 mg | INTRAMUSCULAR | Status: DC | PRN

## 2015-12-17 MED ORDER — FENTANYL CITRATE (PF) 100 MCG/2ML IJ SOLN: 25.0000 ug | INTRAMUSCULAR | Status: DC | PRN

## 2015-12-17 MED ORDER — LIDOCAINE 2% (20 MG/ML) 5 ML SYRINGE
INTRAMUSCULAR | Status: AC
  Filled 2015-12-17: qty 5

## 2015-12-17 MED ORDER — HYDROCODONE-ACETAMINOPHEN 5-325 MG PO TABS: 1.0000 | ORAL_TABLET | Freq: Four times a day (QID) | ORAL | 0 refills | Status: DC | PRN

## 2015-12-17 MED ORDER — GLYCOPYRROLATE 0.2 MG/ML IJ SOLN: 0.2000 mg | Freq: Once | INTRAMUSCULAR | Status: DC | PRN

## 2015-12-17 MED ORDER — MEPERIDINE HCL 25 MG/ML IJ SOLN: 6.2500 mg | INTRAMUSCULAR | Status: DC | PRN

## 2015-12-17 MED ORDER — EPHEDRINE SULFATE-NACL 50-0.9 MG/10ML-% IV SOSY
PREFILLED_SYRINGE | INTRAVENOUS | Status: DC | PRN
  Administered 2015-12-17 (×2): 10 mg via INTRAVENOUS

## 2015-12-17 MED ORDER — BUPIVACAINE-EPINEPHRINE (PF) 0.5% -1:200000 IJ SOLN
INTRAMUSCULAR | Status: DC | PRN
  Administered 2015-12-17: 18 mL

## 2015-12-17 MED ORDER — FENTANYL CITRATE (PF) 100 MCG/2ML IJ SOLN
50.0000 ug | INTRAMUSCULAR | Status: DC | PRN
  Administered 2015-12-17: 50 ug via INTRAVENOUS

## 2015-12-17 MED ORDER — ACETAMINOPHEN 500 MG PO TABS
1000.0000 mg | ORAL_TABLET | ORAL | Status: AC
  Administered 2015-12-17: 1000 mg via ORAL

## 2015-12-17 MED ORDER — DEXAMETHASONE SODIUM PHOSPHATE 10 MG/ML IJ SOLN
INTRAMUSCULAR | Status: AC
  Filled 2015-12-17: qty 1

## 2015-12-17 MED ORDER — ONDANSETRON HCL 4 MG/2ML IJ SOLN
INTRAMUSCULAR | Status: DC | PRN
  Administered 2015-12-17: 4 mg via INTRAVENOUS

## 2015-12-17 MED ORDER — ACETAMINOPHEN 500 MG PO TABS
ORAL_TABLET | ORAL | Status: AC
  Filled 2015-12-17: qty 2

## 2015-12-17 MED ORDER — LIDOCAINE 2% (20 MG/ML) 5 ML SYRINGE
INTRAMUSCULAR | Status: DC | PRN
  Administered 2015-12-17: 80 mg via INTRAVENOUS

## 2015-12-17 MED ORDER — PROPOFOL 10 MG/ML IV BOLUS
INTRAVENOUS | Status: DC | PRN
  Administered 2015-12-17: 100 mg via INTRAVENOUS

## 2015-12-17 MED ORDER — GABAPENTIN 300 MG PO CAPS
300.0000 mg | ORAL_CAPSULE | ORAL | Status: AC
  Administered 2015-12-17: 300 mg via ORAL

## 2015-12-17 MED ORDER — DEXAMETHASONE SODIUM PHOSPHATE 4 MG/ML IJ SOLN
INTRAMUSCULAR | Status: DC | PRN
  Administered 2015-12-17: 5 mg via INTRAVENOUS

## 2015-12-17 MED ORDER — GABAPENTIN 300 MG PO CAPS
ORAL_CAPSULE | ORAL | Status: AC
  Filled 2015-12-17: qty 1

## 2015-12-17 MED ORDER — 0.9 % SODIUM CHLORIDE (POUR BTL) OPTIME
TOPICAL | Status: DC | PRN
  Administered 2015-12-17: 400 mL

## 2015-12-17 MED ORDER — SCOPOLAMINE 1 MG/3DAYS TD PT72: 1.0000 | MEDICATED_PATCH | Freq: Once | TRANSDERMAL | Status: DC | PRN

## 2015-12-17 MED ORDER — PROPOFOL 10 MG/ML IV BOLUS
INTRAVENOUS | Status: AC
  Filled 2015-12-17: qty 20

## 2015-12-17 MED ORDER — FENTANYL CITRATE (PF) 100 MCG/2ML IJ SOLN
INTRAMUSCULAR | Status: AC
  Filled 2015-12-17: qty 2

## 2015-12-17 MED ORDER — EPHEDRINE 5 MG/ML INJ
INTRAVENOUS | Status: AC
  Filled 2015-12-17: qty 10

## 2015-12-17 MED ORDER — CEFAZOLIN SODIUM-DEXTROSE 2-4 GM/100ML-% IV SOLN
2.0000 g | INTRAVENOUS | Status: AC
  Administered 2015-12-17: 2 g via INTRAVENOUS

## 2015-12-17 MED ORDER — ONDANSETRON HCL 4 MG/2ML IJ SOLN
INTRAMUSCULAR | Status: AC
  Filled 2015-12-17: qty 2

## 2015-12-17 MED ORDER — GLYCOPYRROLATE 0.2 MG/ML IV SOSY
PREFILLED_SYRINGE | INTRAVENOUS | Status: AC
  Filled 2015-12-17: qty 3

## 2015-12-17 SURGICAL SUPPLY — 63 items
APPLIER CLIP 9.375 MED OPEN (MISCELLANEOUS) ×3
BENZOIN TINCTURE PRP APPL 2/3 (GAUZE/BANDAGES/DRESSINGS) IMPLANT
BINDER BREAST LRG (GAUZE/BANDAGES/DRESSINGS) ×3 IMPLANT
BINDER BREAST MEDIUM (GAUZE/BANDAGES/DRESSINGS) IMPLANT
BINDER BREAST XLRG (GAUZE/BANDAGES/DRESSINGS) IMPLANT
BINDER BREAST XXLRG (GAUZE/BANDAGES/DRESSINGS) IMPLANT
BLADE HEX COATED 2.75 (ELECTRODE) ×3 IMPLANT
BLADE SURG 10 STRL SS (BLADE) IMPLANT
BLADE SURG 15 STRL LF DISP TIS (BLADE) ×1 IMPLANT
BLADE SURG 15 STRL SS (BLADE) ×2
CANISTER SUC SOCK COL 7IN (MISCELLANEOUS) IMPLANT
CANISTER SUCT 1200ML W/VALVE (MISCELLANEOUS) ×3 IMPLANT
CHLORAPREP W/TINT 26ML (MISCELLANEOUS) ×3 IMPLANT
CLIP APPLIE 9.375 MED OPEN (MISCELLANEOUS) ×1 IMPLANT
CLOSURE WOUND 1/2 X4 (GAUZE/BANDAGES/DRESSINGS)
COVER BACK TABLE 60X90IN (DRAPES) ×3 IMPLANT
COVER MAYO STAND STRL (DRAPES) ×3 IMPLANT
COVER PROBE W GEL 5X96 (DRAPES) ×3 IMPLANT
DECANTER SPIKE VIAL GLASS SM (MISCELLANEOUS) IMPLANT
DERMABOND ADVANCED (GAUZE/BANDAGES/DRESSINGS) ×2
DERMABOND ADVANCED .7 DNX12 (GAUZE/BANDAGES/DRESSINGS) ×1 IMPLANT
DEVICE DUBIN W/COMP PLATE 8390 (MISCELLANEOUS) ×3 IMPLANT
DRAPE LAPAROSCOPIC ABDOMINAL (DRAPES) ×3 IMPLANT
DRAPE UTILITY XL STRL (DRAPES) ×3 IMPLANT
DRSG PAD ABDOMINAL 8X10 ST (GAUZE/BANDAGES/DRESSINGS) ×3 IMPLANT
ELECT REM PT RETURN 9FT ADLT (ELECTROSURGICAL) ×3
ELECTRODE REM PT RTRN 9FT ADLT (ELECTROSURGICAL) ×1 IMPLANT
GAUZE SPONGE 4X4 12PLY STRL (GAUZE/BANDAGES/DRESSINGS) ×3 IMPLANT
GLOVE BIOGEL PI IND STRL 7.0 (GLOVE) ×2 IMPLANT
GLOVE BIOGEL PI INDICATOR 7.0 (GLOVE) ×4
GLOVE ECLIPSE 6.5 STRL STRAW (GLOVE) ×3 IMPLANT
GLOVE EUDERMIC 7 POWDERFREE (GLOVE) ×3 IMPLANT
GOWN STRL REUS W/ TWL LRG LVL3 (GOWN DISPOSABLE) ×1 IMPLANT
GOWN STRL REUS W/ TWL XL LVL3 (GOWN DISPOSABLE) ×1 IMPLANT
GOWN STRL REUS W/TWL LRG LVL3 (GOWN DISPOSABLE) ×2
GOWN STRL REUS W/TWL XL LVL3 (GOWN DISPOSABLE) ×2
ILLUMINATOR WAVEGUIDE N/F (MISCELLANEOUS) IMPLANT
KIT MARKER MARGIN INK (KITS) ×3 IMPLANT
LIGHT WAVEGUIDE WIDE FLAT (MISCELLANEOUS) IMPLANT
NEEDLE HYPO 25X1 1.5 SAFETY (NEEDLE) ×3 IMPLANT
NS IRRIG 1000ML POUR BTL (IV SOLUTION) ×3 IMPLANT
PACK BASIN DAY SURGERY FS (CUSTOM PROCEDURE TRAY) ×3 IMPLANT
PENCIL BUTTON HOLSTER BLD 10FT (ELECTRODE) ×3 IMPLANT
SHEET MEDIUM DRAPE 40X70 STRL (DRAPES) ×3 IMPLANT
SLEEVE SCD COMPRESS KNEE MED (MISCELLANEOUS) ×3 IMPLANT
SPONGE GAUZE 4X4 12PLY STER LF (GAUZE/BANDAGES/DRESSINGS) IMPLANT
SPONGE LAP 18X18 X RAY DECT (DISPOSABLE) IMPLANT
SPONGE LAP 4X18 X RAY DECT (DISPOSABLE) ×3 IMPLANT
STRIP CLOSURE SKIN 1/2X4 (GAUZE/BANDAGES/DRESSINGS) IMPLANT
SUT ETHILON 3 0 FSL (SUTURE) IMPLANT
SUT MNCRL AB 4-0 PS2 18 (SUTURE) ×3 IMPLANT
SUT SILK 2 0 SH (SUTURE) ×3 IMPLANT
SUT VIC AB 2-0 CT1 27 (SUTURE)
SUT VIC AB 2-0 CT1 TAPERPNT 27 (SUTURE) IMPLANT
SUT VIC AB 3-0 SH 27 (SUTURE)
SUT VIC AB 3-0 SH 27X BRD (SUTURE) IMPLANT
SUT VICRYL 3-0 CR8 SH (SUTURE) ×3 IMPLANT
SYRINGE 10CC LL (SYRINGE) ×3 IMPLANT
TOWEL OR 17X24 6PK STRL BLUE (TOWEL DISPOSABLE) ×3 IMPLANT
TOWEL OR NON WOVEN STRL DISP B (DISPOSABLE) IMPLANT
TUBE CONNECTING 20'X1/4 (TUBING) ×1
TUBE CONNECTING 20X1/4 (TUBING) ×2 IMPLANT
YANKAUER SUCT BULB TIP NO VENT (SUCTIONS) ×3 IMPLANT

## 2015-12-17 NOTE — Interval H&P Note (Signed)
History and Physical Interval Note:  12/17/2015 8:05 AM  Rhonda [name]  has presented today for surgery, with the diagnosis of LEFT BREAST CANCER  The various methods of treatment have been discussed with the patient and family. After consideration of risks, benefits and other options for treatment, the patient has consented to  Procedure(s): BREAST LUMPECTOMY WITH RADIOACTIVE SEED LOCALIZATION (Left) as a surgical intervention .  The patient's history has been reviewed, patient examined, no change in status, stable for surgery.  I have reviewed the patient's chart and labs.  Questions were answered to the patient's satisfaction.     [name]

## 2015-12-17 NOTE — Transfer of Care (Signed)
Immediate Anesthesia Transfer of Care Note  Patient: Rhonda [name]  Procedure(s) Performed: Procedure(s): BREAST LUMPECTOMY WITH RADIOACTIVE SEED LOCALIZATION (Left)  Patient Location: PACU  Anesthesia Type:General  Level of Consciousness: sedated and responds to stimulation  Airway & Oxygen Therapy: Patient Spontanous Breathing and Patient connected to face mask oxygen  Post-op Assessment: Report given to RN and Post -op Vital signs reviewed and stable  Post vital signs: Reviewed and stable  Last Vitals:  Vitals:   12/17/15 0739  BP: (!) 158/51  Pulse: (!) 57  Resp: 18  Temp: 36.7 C    Last Pain:  Vitals:   12/17/15 0739  TempSrc: Oral         Complications: No apparent anesthesia complications

## 2015-12-17 NOTE — Anesthesia Preprocedure Evaluation (Signed)
Anesthesia Evaluation  Patient identified by MRN, date of birth, ID band Patient awake    Reviewed: Allergy & Precautions, NPO status , Patient's Chart, lab work & pertinent test results  Airway Mallampati: I  TM Distance: >3 FB Neck ROM: Full    Dental  (+) Teeth Intact, Dental Advisory Given   Pulmonary    breath sounds clear to auscultation       Cardiovascular hypertension, Pt. on medications  Rhythm:Regular Rate:Normal     Neuro/Psych    GI/Hepatic GERD  ,  Endo/Other  diabetes, Well Controlled, Type 2, Oral Hypoglycemic Agents  Renal/GU      Musculoskeletal   Abdominal   Peds  Hematology   Anesthesia Other Findings   Reproductive/Obstetrics                             Anesthesia Physical Anesthesia Plan  ASA: III  Anesthesia Plan: General   Post-op Pain Management:    Induction: Intravenous  Airway Management Planned: LMA  Additional Equipment:   Intra-op Plan:   Post-operative Plan: Extubation in OR  Informed Consent: I have reviewed the patients History and Physical, chart, labs and discussed the procedure including the risks, benefits and alternatives for the proposed anesthesia with the patient or authorized representative who has indicated his/her understanding and acceptance.   Dental advisory given  Plan Discussed with: CRNA, Anesthesiologist and Surgeon  Anesthesia Plan Comments:         Anesthesia Quick Evaluation

## 2015-12-17 NOTE — Anesthesia Procedure Notes (Signed)
Procedure Name: LMA Insertion Date/Time: 12/17/2015 8:46 AM Performed by: Lyndee Leo Pre-anesthesia Checklist: Patient identified, Emergency Drugs available, Suction available and Patient being monitored Patient Re-evaluated:Patient Re-evaluated prior to inductionOxygen Delivery Method: Circle system utilized Preoxygenation: Pre-oxygenation with 100% oxygen Intubation Type: IV induction Ventilation: Mask ventilation without difficulty LMA: LMA inserted LMA Size: 4.0 Number of attempts: 1 Airway Equipment and Method: Bite block Placement Confirmation: positive ETCO2 Tube secured with: Tape Dental Injury: Teeth and Oropharynx as per pre-operative assessment

## 2015-12-17 NOTE — Anesthesia Postprocedure Evaluation (Signed)
Anesthesia Post Note  Patient: Rhonda [name]  Procedure(s) Performed: Procedure(s) (LRB): BREAST LUMPECTOMY WITH RADIOACTIVE SEED LOCALIZATION (Left)  Patient location during evaluation: PACU Anesthesia Type: General Level of consciousness: awake and alert Pain management: pain level controlled Vital Signs Assessment: post-procedure vital signs reviewed and stable Respiratory status: spontaneous breathing, nonlabored ventilation, respiratory function stable and patient connected to face mask oxygen Cardiovascular status: blood pressure returned to baseline and stable Postop Assessment: no signs of nausea or vomiting Anesthetic complications: no    Last Vitals:  Vitals:   12/17/15 1000 12/17/15 1015  BP: (!) 142/55 (!) 147/57  Pulse: 81 78  Resp: (!) 22 (!) 21  Temp:  36.4 C    Last Pain:  Vitals:   12/17/15 1015  TempSrc:   PainSc: 4                  Caio Devera A

## 2015-12-17 NOTE — Discharge Instructions (Signed)
Lumpectomy, Care After Refer to this sheet in the next few weeks. These instructions provide you with information on caring for yourself after your procedure. Your health care provider may also give you more specific instructions. Your treatment has been planned according to current medical practices, but problems sometimes occur. Call your health care provider if you have any problems or questions after your procedure. WHAT TO EXPECT AFTER THE PROCEDURE After your procedure, it is typical to have soreness, bruising, and swelling of your breast. This is normal. You will be given medicines to control your pain. HOME CARE INSTRUCTIONS  Take medicines only as directed by your health care provider.  Resume a normal diet as directed by your health care provider.  Resume normal activity as directed by your health care provider. Avoid strenuous activity that affects the arm on the side that the surgical cut (incision) was made. Avoid playing tennis, swimming, lifting heavy objects (those that weigh more than 10 pounds [4.5 kg]), and pulling for 2 weeks.  Change bandages (dressings) as directed by your health care provider.  Consider wearing a bra to bed if you feel discomfort at the breast. Wearing a bra also helps keep dressings on.  Keep all follow-up visits as directed by your health care provider. This is important.  Call for the results of your procedure as instructed by your surgeon. It is your responsibility to get your test results. Do not assume everything is fine if you have not heard from your health care provider.  Keep the incision site dry.  If the incision site is tender, applying an ice pack may relieve some discomfort. To do this:  Put ice in a plastic bag.  Place a towel between your skin and the bag.  Leave the ice on for 15-20 minutes, 3-4 times a day. SEEK MEDICAL CARE IF:   You have increased bleeding from the incision site.  You notice redness, swelling, or  increasing pain in the incision.  You have pus coming from the incision site.  You have a fever.  You notice a foul smell coming from the incision or dressing. SEEK IMMEDIATE MEDICAL CARE IF:   You develop a rash.  You have shortness of breath.  You have chest pain.   This information is not intended to replace advice given to you by your health care provider. Make sure you discuss any questions you have with your health care provider.   Document Released: 05/27/2006 Document Revised: 06/01/2014 Document Reviewed: 12/08/2012 Elsevier Interactive Patient Education 2016 [place] Anesthesia Home Care Instructions  Activity: Get plenty of rest for the remainder of the day. A responsible adult should stay with you for 24 hours following the procedure.  For the next 24 hours, DO NOT: -Drive a car -Paediatric nurse -Drink alcoholic beverages -Take any medication unless instructed by your physician -Make any legal decisions or sign important papers.  Meals: Start with liquid foods such as gelatin or soup. Progress to regular foods as tolerated. Avoid greasy, spicy, heavy foods. If nausea and/or vomiting occur, drink only clear liquids until the nausea and/or vomiting subsides. Call your physician if vomiting continues.  Special Instructions/Symptoms: Your throat may feel dry or sore from the anesthesia or the breathing tube placed in your throat during surgery. If this causes discomfort, gargle with warm salt water. The discomfort should disappear within 24 hours.  If you had a scopolamine patch placed behind your ear for the management of post- operative nausea and/or vomiting:  1. The medication in the patch is effective for 72 hours, after which it should be removed.  Wrap patch in a tissue and discard in the trash. Wash hands thoroughly with soap and water. 2. You may remove the patch earlier than 72 hours if you experience unpleasant side effects which may include  dry mouth, dizziness or visual disturbances. 3. Avoid touching the patch. Wash your hands with soap and water after contact with the patch.

## 2015-12-17 NOTE — Op Note (Signed)
Patient Name:           Rhonda [name]   Date of Surgery:        12/17/2015  Pre op Diagnosis:      Invasive lobular carcinoma left breast, upper outer quadrant, receptor positive, HER-2 negative.  Clinical stage TIc, N0.  Post op Diagnosis:    Same  Procedure:                 Left partial mastectomy with radioactive seed localization and margin assessment  Surgeon:                     Edsel Petrin. [name], M.D., FACS  Assistant:                      OR staff  Operative Indications:   This is a pleasant 82 year old Caucasian female, referred by Dr. Emmit Pomfret at [facility] mammography for evaluation and management of invasive lobular carcinoma of the left breast, upper outer quadrant. She was seen in the [facility] recently  by Dr. Lindi Adie, Dr. Sondra Come, and me. Dr. Derinda Late is her PCP.      She has no prior history of any significant breast problems other than cyst aspiration. She gets annual mammograms. Recent mammograms showed a small mass in the right breast which was biopsied and showed a benign fibroadenoma. There was also a 1.6 cm mass in the left breast upper outer quadrant which was biopsied and shows invasive lobular carcinoma, estrogen receptor 80%, progesterone receptors 0, Ki-67 5%, HER-2 negative. Ultrasound of the axilla is negative.  Breast MRI showed this to be a solitary finding.Clinically this is a T1c, N0 cancer.      Comorbidities include type 2 diabetes, hypertension, GERD, history of a TIA or stroke with some ataxia requiring a cane when she walks in the yard. She does drive a car some and is independent but family states she is a little less steady on her feet than she has been. No cognitive problems. She's had some orthopedic injuries and fractures. She's had tubal ligation and cataract surgery      Family history is negative for any cancer syndromes.       We had a long talk about management of her left breast cancer. Since this is a solitary finding we have  decided to proceed with lumpectomy.. Dr. Lindi Adie does not plan chemotherapy and does not require any lymph node dissection. He will offer antiestrogen hormone therapy. Dr. Sondra Come may or may not offer radiation therapy depending on final pathology.    Operative Findings:       Radioactive seed was placed in the left breast at [facility] mammography yesterday.  In the holding area I could hear the radioactive seed with the neoprobe.  In the operating room the area of resection was very lateral at the 3:00 position.  The anterior margin of the lumpectomy is simply the skin.  Part of the posterior margin is the lateral border of the pectoralis major muscle and the axillary space.  Specimen mammogram looked good showing the marker clip and the seed in the center of the specimen.  Procedure in Detail:          Following the induction of general LMA anesthesia the patient's left breast and chest wall and axilla were prepped and draped in a sterile fashion.  Surgical timeout was performed.  Intravenous antibiotic given.  0.5% Marcaine with epinephrine was used as local infiltration anesthetic.  Using the neoprobe  I identified the area of maximum radioactivity at 3:00 position far lateral.  I made a very lateral circumareolar curvilinear incision.  Dissection was carried down into the breast tissue using the neoprobe for guidance frequently.  The specimen was removed and marked with silk sutures and a 6 color ink kit to orient the pathologist.  Specimen mammogram looked good as described above.  The specimen was marked with pins as instructed by the radiologist.  This was sent to the lab.  Hemostasis was excellent and achieved with electrocautery.  The wound was irrigated with saline.  I placed 5 metal clips in the lumpectomy cavity at the 5 cardinal positions of the space.  Breast tissues were closed in 2 separate layers with interrupted 3-0 Vicryl and skin closed with running subcuticular 4-0 Monocryl and Dermabond.   Breast binder was placed.  Patient tolerated the procedure well was taken to PACU in stable condition.  EBL 10-20 mL.  Counts correct.  Complications none.     Edsel Petrin. [name], M.D., FACS General and Minimally Invasive Surgery Breast and Colorectal Surgery  12/17/2015 9:44 AM

## 2015-12-18 NOTE — Progress Notes (Signed)
Inform patient of Pathology report,.  Tell her that her breast cancer was 2.3 cm.  About the size we thought it was.  Tell her the margins are negative and she will not need any further surgery.  I will discuss this with her in detail at her next office visit.

## 2015-12-20 ENCOUNTER — Encounter (HOSPITAL_BASED_OUTPATIENT_CLINIC_OR_DEPARTMENT_OTHER): Payer: Self-pay | Admitting: General Surgery

## 2015-12-25 NOTE — Assessment & Plan Note (Signed)
Left Lumpectomy 12/19/15: ILC ,2.3 cm Grade 1, ALH, ER 80%, PR 0%, Ki 67: 5% Staging: T2N0 (Stage 2A)  Pathology counseling: I discussed the final pathology report of the patient provided  a copy of this report. I discussed the margins as well as lymph node surgeries. We also discussed the final staging along with previously performed ER/PR and HER-2/neu testing.  Recommendations: 1. +/- Adjuvant radiation therapy followed by 3. Adjuvant antiestrogen therapy  RTC in 6 months for follow up

## 2015-12-26 ENCOUNTER — Ambulatory Visit (HOSPITAL_BASED_OUTPATIENT_CLINIC_OR_DEPARTMENT_OTHER): Payer: Medicare Other | Admitting: Hematology and Oncology

## 2015-12-26 ENCOUNTER — Other Ambulatory Visit: Payer: Self-pay | Admitting: *Deleted

## 2015-12-26 ENCOUNTER — Encounter: Payer: Self-pay | Admitting: Hematology and Oncology

## 2015-12-26 ENCOUNTER — Telehealth: Payer: Self-pay | Admitting: Hematology and Oncology

## 2015-12-26 DIAGNOSIS — Z170 Estrogen receptor positive status [ER+]: Secondary | ICD-10-CM

## 2015-12-26 DIAGNOSIS — C50412 Malignant neoplasm of upper-outer quadrant of left female breast: Secondary | ICD-10-CM | POA: Diagnosis present

## 2015-12-26 MED ORDER — ANASTROZOLE 1 MG PO TABS: 1.0000 mg | ORAL_TABLET | Freq: Every day | ORAL | 0 refills | Status: DC

## 2015-12-26 NOTE — Progress Notes (Signed)
Patient Care Team: Derinda Late, MD as PCP - General (Family Medicine) [name], MD as Consulting Physician (General Surgery) Nicholas Lose, MD as Consulting Physician (Hematology and Oncology) Gery Pray, MD as Consulting Physician (Radiation Oncology)  DIAGNOSIS: Breast cancer of upper-outer quadrant of left female breast [facility])   Staging form: Breast, AJCC 7th Edition   - Clinical stage from 11/13/2015: Stage IA (T1c, N0, M0) - Unsigned  SUMMARY OF ONCOLOGIC HISTORY:   Breast cancer of upper-outer quadrant of left female breast ([place])   11/04/2015 Initial Diagnosis    Left breast biopsy 2:00 position: Invasive lobular cancer grade 1, ER 80%, PR 0%, Ki-67 5%, HER-2 negative ratio 1.19, screening detected left breast mass 1.6 cm, T1 cN0 stage IA clinical stage     12/17/2015 Surgery    Left Lumpectomy: ILC ,2.3 cm Grade 1, ALH, ER 80%, PR 0%, Ki 67: 5%,        CHIEF COMPLIANT: Follow-up after recent lumpectomy  INTERVAL HISTORY: Rhonda [name] is a 82 year old with above-mentioned history of left breast cancer who underwent lumpectomy and is here today to discuss the pathology report. She is healing very well from the recent surgery. She has mild breast discomfort for which she takes medication Tylenol. Overall she done extremely well from surgery standpoint.  REVIEW OF SYSTEMS:   Constitutional: Denies fevers, chills or abnormal weight loss Eyes: Denies blurriness of vision Ears, nose, mouth, throat, and face: Denies mucositis or sore throat Respiratory: Denies cough, dyspnea or wheezes Cardiovascular: Denies palpitation, chest discomfort Gastrointestinal:  Denies nausea, heartburn or change in bowel habits Skin: Denies abnormal skin rashes Lymphatics: Denies new lymphadenopathy or easy bruising Neurological:Denies numbness, tingling or new weaknesses Behavioral/Psych: Mood is stable, no new changes  Extremities: No lower extremity edema Breast: Recent lumpectomy left  breast All other systems were reviewed with the patient and are negative.  I have reviewed the past medical history, past surgical history, social history and family history with the patient and they are unchanged from previous note.  ALLERGIES:  is allergic to codeine.  MEDICATIONS:  Current Outpatient Prescriptions  Medication Sig Dispense Refill  . aspirin 81 MG tablet Take 81 mg by mouth daily.    . Calcium Carbonate-Vitamin D (CALTRATE 600+D PO) Take 2 tablets by mouth daily.    Marland Kitchen HYDROcodone-acetaminophen (NORCO) 5-325 MG tablet Take 1-2 tablets by mouth every 6 (six) hours as needed. 30 tablet 0  . losartan (COZAAR) 50 MG tablet Take 50 mg by mouth daily.    . metFORMIN (GLUCOPHAGE-XR) 500 MG 24 hr tablet Take 1,000 mg by mouth 2 (two) times daily.    . Multiple Vitamins-Minerals (MULTIVITAMIN & MINERAL PO) Take 1 tablet by mouth every morning.    Marland Kitchen omeprazole (PRILOSEC) 20 MG capsule     . pravastatin (PRAVACHOL) 80 MG tablet Take 80 mg by mouth at bedtime.    . primidone (MYSOLINE) 250 MG tablet     . rOPINIRole (REQUIP) 1 MG tablet Take 1.5 mg by mouth at bedtime.    . sitaGLIPtin (JANUVIA) 100 MG tablet Take 100 mg by mouth daily.     No current facility-administered medications for this visit.     PHYSICAL EXAMINATION: ECOG PERFORMANCE STATUS: 1 - Symptomatic but completely ambulatory  Vitals:   12/26/15 1231  BP: (!) 134/57  Pulse: 60  Resp: 18  Temp: 97.9 F (36.6 C)   Filed Weights   12/26/15 1231  Weight: 148 lb 8 oz (67.4 kg)  GENERAL:alert, no distress and comfortable SKIN: skin color, texture, turgor are normal, no rashes or significant lesions EYES: normal, Conjunctiva are pink and non-injected, sclera clear OROPHARYNX:no exudate, no erythema and lips, buccal mucosa, and tongue normal  NECK: supple, thyroid normal size, non-tender, without nodularity LYMPH:  no palpable lymphadenopathy in the cervical, axillary or inguinal LUNGS: clear to  auscultation and percussion with normal breathing effort HEART: regular rate & rhythm and no murmurs and no lower extremity edema ABDOMEN:abdomen soft, non-tender and normal bowel sounds MUSCULOSKELETAL:no cyanosis of digits and no clubbing  NEURO: alert & oriented x 3 with fluent speech, no focal motor/sensory deficits EXTREMITIES: No lower extremity edema  LABORATORY DATA:  I have reviewed the data as listed   Chemistry      Component Value Date/Time   NA 134 (L) 12/16/2015 0925   NA 130 (L) 11/13/2015 1221   K 4.7 12/16/2015 0925   K 4.9 11/13/2015 1221   CL 98 (L) 12/16/2015 0925   CO2 28 12/16/2015 0925   CO2 23 11/13/2015 1221   BUN 23 (H) 12/16/2015 0925   BUN 22.0 11/13/2015 1221   CREATININE 1.03 (H) 12/16/2015 0925   CREATININE 1.2 (H) 11/13/2015 1221      Component Value Date/Time   CALCIUM 9.2 12/16/2015 0925   CALCIUM 9.3 11/13/2015 1221   ALKPHOS 66 11/13/2015 1221   AST 24 11/13/2015 1221   ALT 31 11/13/2015 1221   BILITOT <0.30 11/13/2015 1221       Lab Results  Component Value Date   WBC 6.3 11/13/2015   HGB 11.6 11/13/2015   HCT 35.1 11/13/2015   MCV 88.8 11/13/2015   PLT 191 11/13/2015   NEUTROABS 4.8 11/13/2015     ASSESSMENT & PLAN:  Breast cancer of upper-outer quadrant of left female breast ([place]) Left Lumpectomy 12/19/15: ILC ,2.3 cm Grade 1, ALH, ER 80%, PR 0%, Ki 67: 5% Staging: T2Nx (Stage 2A)  Pathology counseling: I discussed the final pathology report of the patient provided  a copy of this report. I discussed the margins as well as lymph node surgeries. We also discussed the final staging along with previously performed ER/PR and HER-2/neu testing.  Recommendations: Adjuvant antiestrogen therapy With anastrozole 1 mg daily. She wants to try it for a month and if she can tolerate it she will continue it. If she cannot tolerate it she will discontinue it.  Anastrozole counseling:We discussed the risks and benefits of anti-estrogen  therapy with aromatase inhibitors. These include but not limited to insomnia, hot flashes, mood changes, vaginal dryness, bone density loss, and weight gain. We strongly believe that the benefits far outweigh the risks. Patient understands these risks and consented to starting treatment. Planned treatment duration is 5 years.  Patient did not want to undergo radiation therapy. We will call and cancel her radiation appointment.  RTC in 3 months   No orders of the defined types were placed in this encounter.  The patient has a good understanding of the overall plan. she agrees with it. she will call with any problems that may develop before the next visit here.   Rulon Eisenmenger, MD 12/26/15

## 2015-12-26 NOTE — Telephone Encounter (Signed)
per pof to sch pt appt-gave pt copy of avs °

## 2015-12-30 ENCOUNTER — Encounter: Payer: Self-pay | Admitting: *Deleted

## 2016-01-06 ENCOUNTER — Telehealth: Payer: Self-pay

## 2016-01-06 NOTE — Telephone Encounter (Signed)
Pt called to verify that her appt w/ Dr Sondra Come had been cancelled. Pt also verified next appt with Dr Lindi Adie

## 2016-01-08 ENCOUNTER — Ambulatory Visit: Payer: Medicare Other

## 2016-01-08 ENCOUNTER — Ambulatory Visit: Payer: Medicare Other | Admitting: Radiation Oncology

## 2016-02-24 ENCOUNTER — Other Ambulatory Visit: Payer: Self-pay

## 2016-02-24 DIAGNOSIS — C50412 Malignant neoplasm of upper-outer quadrant of left female breast: Secondary | ICD-10-CM

## 2016-02-24 MED ORDER — ANASTROZOLE 1 MG PO TABS: 1.0000 mg | ORAL_TABLET | Freq: Every day | ORAL | 0 refills | Status: DC

## 2016-02-28 ENCOUNTER — Ambulatory Visit: Payer: Medicare Other | Admitting: Hematology and Oncology

## 2016-03-06 ENCOUNTER — Encounter: Payer: Self-pay | Admitting: Hematology and Oncology

## 2016-03-06 ENCOUNTER — Ambulatory Visit (HOSPITAL_BASED_OUTPATIENT_CLINIC_OR_DEPARTMENT_OTHER): Payer: Medicare Other | Admitting: Hematology and Oncology

## 2016-03-06 DIAGNOSIS — C50412 Malignant neoplasm of upper-outer quadrant of left female breast: Secondary | ICD-10-CM | POA: Diagnosis present

## 2016-03-06 DIAGNOSIS — Z170 Estrogen receptor positive status [ER+]: Secondary | ICD-10-CM

## 2016-03-06 DIAGNOSIS — Z79811 Long term (current) use of aromatase inhibitors: Secondary | ICD-10-CM

## 2016-03-06 MED ORDER — ANASTROZOLE 1 MG PO TABS: 1.0000 mg | ORAL_TABLET | Freq: Every day | ORAL | 0 refills | Status: DC

## 2016-03-06 NOTE — Progress Notes (Signed)
Patient Care Team: Derinda Late, MD as PCP - General (Family Medicine) [name], MD as Consulting Physician (General Surgery) Nicholas Lose, MD as Consulting Physician (Hematology and Oncology) Gery Pray, MD as Consulting Physician (Radiation Oncology)  DIAGNOSIS: Breast cancer of upper-outer quadrant of left female breast [facility])   Staging form: Breast, AJCC 7th Edition   - Clinical stage from 11/13/2015: Stage IA (T1c, N0, M0) - Unsigned  SUMMARY OF ONCOLOGIC HISTORY:   Breast cancer of upper-outer quadrant of left female breast ([place])   11/04/2015 Initial Diagnosis    Left breast biopsy 2:00 position: Invasive lobular cancer grade 1, ER 80%, PR 0%, Ki-67 5%, HER-2 negative ratio 1.19, screening detected left breast mass 1.6 cm, T1 cN0 stage IA clinical stage      12/17/2015 Surgery    Left Lumpectomy: ILC ,2.3 cm Grade 1, ALH, ER 80%, PR 0%, Ki 67: 5%,        01/06/2016 -  Anti-estrogen oral therapy    Anastrozole 1 mg by mouth daily 5 years       CHIEF COMPLIANT: Follow-up on anastrozole   INTERVAL HISTORY: Rhonda [name] is a 82 year old with above-mentioned history left breast cancer treated with lumpectomy. She refuses radiation therapy and is currently on anastrozole. She reports feeling hot at night time but not hot flashes or night sweats. She has muscle aches and pains but they're no different than before. She spends most of her time in a sedentary position. She is not exercising. [place].  REVIEW OF SYSTEMS:   Constitutional: Denies fevers, chills or abnormal weight loss Eyes: Denies blurriness of vision Ears, nose, mouth, throat, and face: Denies mucositis or sore throat Respiratory: Denies cough, dyspnea or wheezes Cardiovascular: Denies palpitation, chest discomfort Gastrointestinal:  Denies nausea, heartburn or change in bowel habits Skin: Denies abnormal skin rashes Lymphatics: Denies new lymphadenopathy or easy  bruising Neurological:Denies numbness, tingling or new weaknesses Behavioral/Psych: Mood is stable, no new changes  Extremities: No lower extremity edema Breast:  denies any pain or lumps or nodules in either breasts All other systems were reviewed with the patient and are negative.  I have reviewed the past medical history, past surgical history, social history and family history with the patient and they are unchanged from previous note.  ALLERGIES:  is allergic to codeine.  MEDICATIONS:  Current Outpatient Prescriptions  Medication Sig Dispense Refill  . anastrozole (ARIMIDEX) 1 MG tablet Take 1 tablet (1 mg total) by mouth daily. 90 tablet 0  . aspirin 81 MG tablet Take 81 mg by mouth daily.    . Calcium Carbonate-Vitamin D (CALTRATE 600+D PO) Take 2 tablets by mouth daily.    Marland Kitchen HYDROcodone-acetaminophen (NORCO) 5-325 MG tablet Take 1-2 tablets by mouth every 6 (six) hours as needed. 30 tablet 0  . losartan (COZAAR) 50 MG tablet Take 50 mg by mouth daily.    . metFORMIN (GLUCOPHAGE-XR) 500 MG 24 hr tablet Take 1,000 mg by mouth 2 (two) times daily.    . Multiple Vitamins-Minerals (MULTIVITAMIN & MINERAL PO) Take 1 tablet by mouth every morning.    Marland Kitchen omeprazole (PRILOSEC) 20 MG capsule     . pravastatin (PRAVACHOL) 80 MG tablet Take 80 mg by mouth at bedtime.    . primidone (MYSOLINE) 250 MG tablet     . rOPINIRole (REQUIP) 1 MG tablet Take 1.5 mg by mouth at bedtime.    . sitaGLIPtin (JANUVIA) 100 MG tablet Take 100  mg by mouth daily.     No current facility-administered medications for this visit.     PHYSICAL EXAMINATION: ECOG PERFORMANCE STATUS: 1 - Symptomatic but completely ambulatory  Vitals:   03/06/16 1031  BP: (!) 145/54  Pulse: 75  Resp: 18  Temp: 97.9 F (36.6 C)   Filed Weights   03/06/16 1031  Weight: 153 lb 9.6 oz (69.7 kg)    GENERAL:alert, no distress and comfortable SKIN: skin color, texture, turgor are normal, no rashes or significant  lesions EYES: normal, Conjunctiva are pink and non-injected, sclera clear OROPHARYNX:no exudate, no erythema and lips, buccal mucosa, and tongue normal  NECK: supple, thyroid normal size, non-tender, without nodularity LYMPH:  no palpable lymphadenopathy in the cervical, axillary or inguinal LUNGS: clear to auscultation and percussion with normal breathing effort HEART: regular rate & rhythm and no murmurs and no lower extremity edema ABDOMEN:abdomen soft, non-tender and normal bowel sounds MUSCULOSKELETAL:no cyanosis of digits and no clubbing  NEURO: alert & oriented x 3 with fluent speech, no focal motor/sensory deficits EXTREMITIES: No lower extremity edema BREAST: No palpable masses or nodules in either right or left breasts. No palpable axillary supraclavicular or infraclavicular adenopathy no breast tenderness or nipple discharge. (exam performed in the presence of a chaperone)  LABORATORY DATA:  I have reviewed the data as listed   Chemistry      Component Value Date/Time   NA 134 (L) 12/16/2015 0925   NA 130 (L) 11/13/2015 1221   K 4.7 12/16/2015 0925   K 4.9 11/13/2015 1221   CL 98 (L) 12/16/2015 0925   CO2 28 12/16/2015 0925   CO2 23 11/13/2015 1221   BUN 23 (H) 12/16/2015 0925   BUN 22.0 11/13/2015 1221   CREATININE 1.03 (H) 12/16/2015 0925   CREATININE 1.2 (H) 11/13/2015 1221      Component Value Date/Time   CALCIUM 9.2 12/16/2015 0925   CALCIUM 9.3 11/13/2015 1221   ALKPHOS 66 11/13/2015 1221   AST 24 11/13/2015 1221   ALT 31 11/13/2015 1221   BILITOT <0.30 11/13/2015 1221       Lab Results  Component Value Date   WBC 6.3 11/13/2015   HGB 11.6 11/13/2015   HCT 35.1 11/13/2015   MCV 88.8 11/13/2015   PLT 191 11/13/2015   NEUTROABS 4.8 11/13/2015     ASSESSMENT & PLAN:  Breast cancer of upper-outer quadrant of left female breast ([place]) Left Lumpectomy 12/19/15: ILC ,2.3 cm Grade 1, ALH, ER 80%, PR 0%, Ki 67: 5% Staging: T2Nx (Stage 2A) Patient refused  radiation therapy.  Current treatment: Adjuvant antiestrogen therapy with anastrozole 1 mg daily started 12/26/2015 Anastrozole toxicities: 1. Mild hot flashes 2. chronic musculoskeletal aches and pains but no different than prior to starting antiestrogen therapy.  Breast cancer surveillance: Annual mammograms and periodic breast exams.  Return to clinic in 6 months for follow-up.   No orders of the defined types were placed in this encounter.  The patient has a good understanding of the overall plan. she agrees with it. she will call with any problems that may develop before the next visit here.   Rulon Eisenmenger, MD 03/06/16

## 2016-03-06 NOTE — Assessment & Plan Note (Signed)
Left Lumpectomy 12/19/15: ILC ,2.3 cm Grade 1, ALH, ER 80%, PR 0%, Ki 67: 5% Staging: T2Nx (Stage 2A) Patient refused radiation therapy.  Current treatment: Adjuvant antiestrogen therapy with anastrozole 1 mg daily started 12/26/2015 Anastrozole toxicities:  Breast cancer surveillance: Annual mammograms and periodic breast exams.  Return to clinic in 6 months for follow-up.

## 2016-06-15 ENCOUNTER — Telehealth: Payer: Self-pay

## 2016-06-15 ENCOUNTER — Other Ambulatory Visit: Payer: Self-pay

## 2016-06-15 DIAGNOSIS — C50412 Malignant neoplasm of upper-outer quadrant of left female breast: Secondary | ICD-10-CM

## 2016-06-15 MED ORDER — ANASTROZOLE 1 MG PO TABS: 1.0000 mg | ORAL_TABLET | Freq: Every day | ORAL | 0 refills | Status: DC

## 2016-06-15 NOTE — Telephone Encounter (Signed)
Pt husband called to notify of new pharmacy and will need pt refill for anastrozole to be sent in. Will refill to [facility] in [place].

## 2016-07-20 ENCOUNTER — Ambulatory Visit
Admission: RE | Admit: 2016-07-20 | Discharge: 2016-07-20 | Disposition: A | Discharge status: Home / Self Care | Payer: Medicare Other | Source: Ambulatory Visit | Attending: Family Medicine | Admitting: Family Medicine

## 2016-07-20 ENCOUNTER — Other Ambulatory Visit: Payer: Self-pay | Admitting: Family Medicine

## 2016-07-20 DIAGNOSIS — G8929 Other chronic pain: Secondary | ICD-10-CM

## 2016-07-20 DIAGNOSIS — M533 Sacrococcygeal disorders, not elsewhere classified: Principal | ICD-10-CM

## 2016-09-02 ENCOUNTER — Other Ambulatory Visit: Payer: Self-pay | Admitting: Family Medicine

## 2016-09-02 DIAGNOSIS — N189 Chronic kidney disease, unspecified: Secondary | ICD-10-CM

## 2016-09-04 ENCOUNTER — Ambulatory Visit (HOSPITAL_BASED_OUTPATIENT_CLINIC_OR_DEPARTMENT_OTHER): Payer: Medicare Other | Admitting: Adult Health

## 2016-09-04 ENCOUNTER — Encounter: Payer: Self-pay | Admitting: Adult Health

## 2016-09-04 DIAGNOSIS — Z79811 Long term (current) use of aromatase inhibitors: Secondary | ICD-10-CM | POA: Diagnosis not present

## 2016-09-04 DIAGNOSIS — C50412 Malignant neoplasm of upper-outer quadrant of left female breast: Secondary | ICD-10-CM

## 2016-09-04 DIAGNOSIS — M8580 Other specified disorders of bone density and structure, unspecified site: Secondary | ICD-10-CM | POA: Diagnosis not present

## 2016-09-04 DIAGNOSIS — M791 Myalgia: Secondary | ICD-10-CM

## 2016-09-04 DIAGNOSIS — N951 Menopausal and female climacteric states: Secondary | ICD-10-CM

## 2016-09-04 DIAGNOSIS — Z170 Estrogen receptor positive status [ER+]: Principal | ICD-10-CM

## 2016-09-04 MED ORDER — ANASTROZOLE 1 MG PO TABS: 1.0000 mg | ORAL_TABLET | Freq: Every day | ORAL | 3 refills | Status: DC

## 2016-09-04 NOTE — Assessment & Plan Note (Signed)
Left Lumpectomy 12/19/15: ILC ,2.3 cm Grade 1, ALH, ER 80%, PR 0%, Ki 67: 5% Staging: T2Nx(Stage 2A) Patient refused radiation therapy.  Current treatment: Adjuvant antiestrogen therapy with anastrozole 1 mg daily started 12/26/2015 Anastrozole toxicities: 1. Mild hot flashes 2. chronic musculoskeletal aches and pains but no different than prior to starting antiestrogen therapy.  Breast cancer surveillance: Annual mammograms and periodic breast exams.  Return to clinic in 1 year for follow-up.

## 2016-09-04 NOTE — Patient Instructions (Signed)

## 2016-09-04 NOTE — Progress Notes (Signed)
Patient Care Team: Derinda Late, MD as PCP - General (Family Medicine) [name], MD as Consulting Physician (General Surgery) Nicholas Lose, MD as Consulting Physician (Hematology and Oncology) Gery Pray, MD as Consulting Physician (Radiation Oncology)  DIAGNOSIS: Cancer Staging Breast cancer of upper-outer quadrant of left female breast [facility]) Staging form: Breast, AJCC 7th Edition - Clinical stage from 11/13/2015: Stage IA (T1c, N0, M0) - Unsigned - Pathologic: Stage IIA (T2, N0, cM0) - Unsigned   SUMMARY OF ONCOLOGIC HISTORY:   Breast cancer of upper-outer quadrant of left female breast ([place])   11/04/2015 Initial Diagnosis    Left breast biopsy 2:00 position: Invasive lobular cancer grade 1, ER 80%, PR 0%, Ki-67 5%, HER-2 negative ratio 1.19, screening detected left breast mass 1.6 cm, T1 cN0 stage IA clinical stage      12/17/2015 Surgery    Left Lumpectomy [name]): ILC, 2.3 cm, Grade 1, ALH, ER 80%, PR 0%, Ki 67: 5%, HER-2 negative (1.19 ratio).      01/06/2016 -  Anti-estrogen oral therapy    Anastrozole 1 mg by mouth daily 5 years       CHIEF COMPLIANT: Follow-up on anastrozole   INTERVAL HISTORY: Rhonda [name] is a 83 year old with above-mentioned history left breast cancer treated with lumpectomy. She is doing well today.  She is taking Anastrozole daily and for the most part is tolerating it well.  She last had mammo and bone density in June, 2017.  She has had issues with her blood pressure and controlling in addition to issues with some sciatic pain that she is finally over and recovering from.  Other than the sciatic pain, she denies other joint aches and pains.  She denies vaginal dryness, or hot flashes.  She does have osteopenia in her left femur on DEXA from 10/2015 and she is taking 2 calcium tablets per day.    REVIEW OF SYSTEMS:   Review of Systems  Constitutional: Negative for chills, fever, malaise/fatigue and weight loss.  HENT: Negative for  hearing loss and tinnitus.   Eyes: Negative for blurred vision and double vision.  Respiratory: Negative for cough and shortness of breath.   Cardiovascular: Negative for chest pain, palpitations and leg swelling.  Gastrointestinal: Negative for abdominal pain, blood in stool, constipation, diarrhea, heartburn, nausea and vomiting.  Skin: Negative for rash.  Neurological: Negative for dizziness, focal weakness and headaches.       Balance issues, has happened in past, will see neurology in next couple of weeks   Psychiatric/Behavioral: Negative for depression. The patient is not nervous/anxious.      I have reviewed the past medical history, past surgical history, social history and family history with the patient and they are unchanged from previous note.  ALLERGIES:  is allergic to codeine.  MEDICATIONS:  Current Outpatient Prescriptions  Medication Sig Dispense Refill  . anastrozole (ARIMIDEX) 1 MG tablet Take 1 tablet (1 mg total) by mouth daily. 90 tablet 3  . aspirin 81 MG tablet Take 81 mg by mouth daily.    . metFORMIN (GLUCOPHAGE-XR) 500 MG 24 hr tablet Take 1,000 mg by mouth 2 (two) times daily.    . metoprolol succinate (TOPROL-XL) 50 MG 24 hr tablet Take 50 mg by mouth daily. Take with or immediately following a meal.    . Multiple Vitamins-Minerals (MULTIVITAMIN & MINERAL PO) Take 1 tablet by mouth every morning.    Marland Kitchen omeprazole (PRILOSEC) 20 MG capsule     . pravastatin (PRAVACHOL) 80 MG tablet  Take 80 mg by mouth at bedtime.    . primidone (MYSOLINE) 250 MG tablet     . rOPINIRole (REQUIP) 1 MG tablet Take 1.5 mg by mouth at bedtime.    . sitaGLIPtin (JANUVIA) 100 MG tablet Take 100 mg by mouth daily.    . Calcium Carbonate-Vitamin D (CALTRATE 600+D PO) Take 2 tablets by mouth daily.     No current facility-administered medications for this visit.     PHYSICAL EXAMINATION: ECOG PERFORMANCE STATUS: 1 - Symptomatic but completely ambulatory  Vitals:   09/04/16 1036   BP: (!) 166/57  Pulse: (!) 56  Resp: 17  Temp: 97.6 F (36.4 C)   Filed Weights   09/04/16 1036  Weight: 151 lb 14.4 oz (68.9 kg)   GENERAL: Patient is a well appearing female in no acute distress HEENT:  Sclerae anicteric.  Oropharynx clear and moist. No ulcerations or evidence of oropharyngeal candidiasis. Neck is supple.  NODES:  No cervical, supraclavicular, or axillary lymphadenopathy palpated.  BREAST EXAM:  Right breast without nodules, masses, skin or nipple changes, left breast s/p lumpectomy, site is well healed with minimal scar tissue, no nodules, masses, skin or nipple changes noted.  Benign bilateral breast exam.  LUNGS:  Clear to auscultation bilaterally.  No wheezes or rhonchi. HEART:  Regular rate and rhythm. No murmur appreciated. ABDOMEN:  Soft, nontender.  Positive, normoactive bowel sounds. No organomegaly palpated. MSK:  No focal spinal tenderness to palpation. Full range of motion bilaterally in the upper extremities. EXTREMITIES:  No peripheral edema.   SKIN:  Clear with no obvious rashes or skin changes. No nail dyscrasia. NEURO:  Nonfocal. Well oriented.  Appropriate affect.    LABORATORY DATA:  I have reviewed the data as listed   Chemistry      Component Value Date/Time   NA 134 (L) 12/16/2015 0925   NA 130 (L) 11/13/2015 1221   K 4.7 12/16/2015 0925   K 4.9 11/13/2015 1221   CL 98 (L) 12/16/2015 0925   CO2 28 12/16/2015 0925   CO2 23 11/13/2015 1221   BUN 23 (H) 12/16/2015 0925   BUN 22.0 11/13/2015 1221   CREATININE 1.03 (H) 12/16/2015 0925   CREATININE 1.2 (H) 11/13/2015 1221      Component Value Date/Time   CALCIUM 9.2 12/16/2015 0925   CALCIUM 9.3 11/13/2015 1221   ALKPHOS 66 11/13/2015 1221   AST 24 11/13/2015 1221   ALT 31 11/13/2015 1221   BILITOT <0.30 11/13/2015 1221       Lab Results  Component Value Date   WBC 6.3 11/13/2015   HGB 11.6 11/13/2015   HCT 35.1 11/13/2015   MCV 88.8 11/13/2015   PLT 191 11/13/2015    NEUTROABS 4.8 11/13/2015     ASSESSMENT & PLAN:  Breast cancer of upper-outer quadrant of left female breast ([place]) Left Lumpectomy 12/19/15: ILC ,2.3 cm Grade 1, ALH, ER 80%, PR 0%, Ki 67: 5% Staging: T2Nx (Stage 2A) Patient refused radiation therapy.  Current treatment: Adjuvant antiestrogen therapy with anastrozole 1 mg daily started 12/26/2015 Anastrozole toxicities: 1. Mild hot flashes 2. chronic musculoskeletal aches and pains but no different than prior to starting antiestrogen therapy.  Breast cancer surveillance: Annual mammograms and periodic breast exams.    Osteopenia: Due for DEXA in 2019.  I recommended continued calcium with vitamin D along with weight bearing exercises.    Return to clinic in 6 months for follow-up.   Orders Placed This Encounter  Procedures  .  MM DIAG BREAST TOMO BILATERAL    Standing Status:   Future    Standing Expiration Date:   11/04/2017    Order Specific Question:   Reason for Exam (SYMPTOM  OR DIAGNOSIS REQUIRED)    Answer:   breast cancer    Order Specific Question:   Preferred imaging location?    Answer:   External    Comments:   [facility]   The patient has a good understanding of the overall plan. she agrees with it. she will call with any problems that may develop before the next visit here.  A total of (30) minutes of face-to-face time was spent with this patient with greater than 50% of that time in counseling and care-coordination.    Scot Dock, NP 09/04/16

## 2016-09-07 ENCOUNTER — Ambulatory Visit: Payer: Self-pay | Admitting: Neurology

## 2016-09-07 ENCOUNTER — Telehealth: Payer: Self-pay | Admitting: *Deleted

## 2016-09-07 NOTE — Telephone Encounter (Signed)
R/s appt from 09/07/16 to 09/22/16 at 930am- office closed d/t power outage. Spoke with pt and husband. They would like sooner appt if possible. Advised I will have to speak with Dr Jannifer Franklin when back in the office to see if we can fit them in sooner. Will call back after speaking with Dr Jannifer Franklin.

## 2016-09-08 NOTE — Telephone Encounter (Signed)
Called and spoke with husband. Made appt for tomorrow morning at 730am, check in 700am. Advised them of late policy, fee. Advised them to bring updated medication list with them. He verbalized understanding. cx appt for 09/22/16.

## 2016-09-09 ENCOUNTER — Ambulatory Visit
Admission: RE | Admit: 2016-09-09 | Discharge: 2016-09-09 | Disposition: A | Discharge status: Home / Self Care | Payer: Medicare Other | Source: Ambulatory Visit | Attending: Family Medicine | Admitting: Family Medicine

## 2016-09-09 ENCOUNTER — Encounter: Payer: Self-pay | Admitting: Neurology

## 2016-09-09 ENCOUNTER — Ambulatory Visit (INDEPENDENT_AMBULATORY_CARE_PROVIDER_SITE_OTHER): Payer: Medicare Other | Admitting: Neurology

## 2016-09-09 VITALS — BP 182/68 | HR 56 | Resp 20 | Ht 64.0 in | Wt 149.0 lb

## 2016-09-09 DIAGNOSIS — E538 Deficiency of other specified B group vitamins: Secondary | ICD-10-CM

## 2016-09-09 DIAGNOSIS — G250 Essential tremor: Secondary | ICD-10-CM | POA: Diagnosis not present

## 2016-09-09 DIAGNOSIS — R269 Unspecified abnormalities of gait and mobility: Secondary | ICD-10-CM

## 2016-09-09 DIAGNOSIS — N189 Chronic kidney disease, unspecified: Secondary | ICD-10-CM

## 2016-09-09 HISTORY — DX: Essential tremor: G25.0

## 2016-09-09 HISTORY — DX: Unspecified abnormalities of gait and mobility: R26.9

## 2016-09-09 NOTE — Progress Notes (Signed)
Reason for visit: Gait disorder  Referring physician: Dr. Lorette Ang is a 83 y.o. [name]  History of present illness:  Rhonda [name] is an 83 year old Rhonda [name] with a history of an essential tremor. The patient has a prominent family history of tremor; a paternal aunt, her father, and a brother and a sister have similar tremors. The patient has noted some problems with gait instability that has been present for about 6 years, but this has worsened over time, particularly within the last several months. The patient sustained a fall in December 2017. She has not fallen since. She is now using a cane for an ambulation. She has been given some balance training exercises to do which she is engaging in currently. The patient recently has had significant issues with elevated blood pressures, and she has had declining renal function. She has been on primidone for a number of years taking 250 mg in the morning and 125 mg in the evening. The patient reports that she has some discomfort in the right hip and right back, there is no radiation of pain down the leg on that side. She does have restless leg syndrome and a history of diabetes, but she reports no numbness or burning or stinging sensations in the feet. She does have some problems with incontinence of the bowels and bladder that has been present over the last couple of months. She reports no weakness of the extremities, she does not have headaches or dizziness or syncope. The patient will often times feel over balanced, going forward and occasionally backwards. The patient will take sidesteps with walking. She denies any neck pain or pain down the arms. She is sent to this office for further evaluation. Recent blood work has shown a thyroid profile of 4.67, hemoglobin A1c of 6.5. The patient has been on omeprazole previously, she is currently off this medication.  Past Medical History:  Diagnosis Date  . Arthritis    fingers  . Benign essential tremor    on primadone  . Breast cancer of upper-outer quadrant of left [name] breast ([facility]) 11/06/2015  . Diabetes mellitus without complication ([facility])   . Gait disorder 09/09/2016  . GERD (gastroesophageal reflux disease)   . Hyperlipidemia   . Hypertension   . Pneumonia   . Tremor, essential 09/09/2016    Past Surgical History:  Procedure Laterality Date  . BREAST LUMPECTOMY WITH RADIOACTIVE SEED LOCALIZATION Left 12/17/2015   Procedure: BREAST LUMPECTOMY WITH RADIOACTIVE SEED LOCALIZATION;  Surgeon: [name], MD;  Location: [facility];  Service: General;  Laterality: Left;  . LEG SURGERY Left    for fracture  . TUBAL LIGATION    . WRIST SURGERY Left     Family History  Problem Relation Age of Onset  . ALS Mother   . Tremor Father   . Heart attack Father   . Tremor Sister   . Tremor Brother     Social history:  reports that she has never smoked. She has never used smokeless tobacco. She reports that she drinks alcohol. She reports that she does not use drugs.  Medications:  Prior to Admission medications   Medication Sig Start Date End Date Taking? Authorizing Provider  anastrozole (ARIMIDEX) 1 MG tablet Take 1 tablet (1 mg total) by mouth daily. 09/04/16  Yes Gardenia Phlegm, NP  aspirin 81 MG tablet Take 81 mg by mouth daily.   Yes Historical Provider, MD  Calcium Carbonate-Vitamin D (CALTRATE 600+D  PO) Take 2 tablets by mouth daily.   Yes Historical Provider, MD  metFORMIN (GLUCOPHAGE-XR) 500 MG 24 hr tablet Take 1,000 mg by mouth 2 (two) times daily.   Yes Historical Provider, MD  metoprolol succinate (TOPROL-XL) 50 MG 24 hr tablet Take 50 mg by mouth daily. Take with or immediately following a meal.   Yes Historical Provider, MD  Multiple Vitamins-Minerals (MULTIVITAMIN & MINERAL PO) Take 1 tablet by mouth every morning.   Yes Historical Provider, MD  omeprazole (PRILOSEC) 20 MG capsule  09/20/15  Yes Historical  Provider, MD  pravastatin (PRAVACHOL) 80 MG tablet Take 80 mg by mouth at bedtime.   Yes Historical Provider, MD  primidone (MYSOLINE) 250 MG tablet  10/26/15  Yes Historical Provider, MD  rOPINIRole (REQUIP) 1 MG tablet Take 1.5 mg by mouth at bedtime.   Yes Historical Provider, MD  sitaGLIPtin (JANUVIA) 100 MG tablet Take 100 mg by mouth daily.   Yes Historical Provider, MD      Allergies  Allergen Reactions  . Codeine Other (See Comments)    Headache    ROS:  Out of a complete 14 system review of symptoms, the patient complains only of the following symptoms, and all other reviewed systems are negative.  Double vision  Blood pressure (!) 182/68, pulse (!) 56, resp. rate 20, height 5\' 4"  (1.626 m), weight 149 lb (67.6 kg).    Blood pressure, right arm, sitting is 200/84. Blood pressure, right arm, standing is 208/80.  Physical Exam  General: The patient is alert and cooperative at the time of the examination.  Eyes: Pupils are equal, round, and reactive to light. Discs are flat bilaterally.  Neck: The neck is supple, no carotid bruits are noted.  Respiratory: The respiratory examination is clear.  Cardiovascular: The cardiovascular examination reveals a regular rate and rhythm, no obvious murmurs or rubs are noted.  Skin: Extremities are without significant edema.  Neurologic Exam  Mental status: The patient is alert and oriented x 3 at the time of the examination. The patient has apparent normal recent and remote memory, with an apparently normal attention span and concentration ability.  Cranial nerves: Facial symmetry is present. There is good sensation of the face to pinprick and soft touch bilaterally. The strength of the facial muscles and the muscles to head turning and shoulder shrug are normal bilaterally. Speech is well enunciated, no aphasia or dysarthria is noted. Extraocular movements are full. Visual fields are full. The tongue is midline, and the patient has  symmetric elevation of the soft palate. No obvious hearing deficits are noted.  Motor: The motor testing reveals 5 over 5 strength of all 4 extremities. Good symmetric motor tone is noted throughout.  Sensory: Sensory testing is intact to pinprick, soft touch, vibration sensation, and position sense on all 4 extremities. No evidence of a stocking pattern pinprick sensory deficit is seen. No evidence of extinction is noted.  Coordination: Cerebellar testing reveals good finger-nose-finger and heel-to-shin bilaterally.  Gait and station: Gait is slightly wide-based, unsteady, the patient uses a cane for ambulation. Tandem gait is unsteady. Romberg is positive, the patient falls to the side. No drift is seen.  Reflexes: Deep tendon reflexes are symmetric and normal bilaterally. Ankle jerk reflexes are maintained bilaterally. Toes are downgoing bilaterally.   Assessment/Plan:  1. Essential tremor  2. Gait instability  The patient is having a gradual change in her ability to ambulate over the last 6 years. Individuals with essential tremors oftentimes do  have an associated gait disorder. The patient is on Mysoline, and her renal function has been declining over the last several months. The patient will be set up for MRI of the brain, the patient has significantly elevated blood pressures. Small vessel disease needs to be excluded. The patient will undergo blood work today to include a primidone and phenobarbital level. She is to continue her exercises for gait training. She will follow-up in 4 months. We may consider lowering the primidone dose in the future.  Jill Alexanders MD 09/09/2016 7:51 AM  [facility] [address] [place], [place] [address]  Phone [phone] Fax [phone]

## 2016-09-09 NOTE — Patient Instructions (Addendum)
We will check blood work today and get MRI of the brain.   Fall Prevention in the Home Falls can cause injuries and can affect people from all age groups. There are many simple things that you can do to make your home safe and to help prevent falls. What can I do on the outside of my home?  Regularly repair the edges of walkways and driveways and fix any cracks.  Remove high doorway thresholds.  Trim any shrubbery on the main path into your home.  Use bright outdoor lighting.  Clear walkways of debris and clutter, including tools and rocks.  Regularly check that handrails are securely fastened and in good repair. Both sides of any steps should have handrails.  Install guardrails along the edges of any raised decks or porches.  Have leaves, snow, and ice cleared regularly.  Use sand or salt on walkways during winter months.  In the garage, clean up any spills right away, including grease or oil spills. What can I do in the bathroom?  Use night lights.  Install grab bars by the toilet and in the tub and shower. Do not use towel bars as grab bars.  Use non-skid mats or decals on the floor of the tub or shower.  If you need to sit down while you are in the shower, use a plastic, non-slip stool.  Keep the floor dry. Immediately clean up any water that spills on the floor.  Remove soap buildup in the tub or shower on a regular basis.  Attach bath mats securely with double-sided non-slip rug tape.  Remove throw rugs and other tripping hazards from the floor. What can I do in the bedroom?  Use night lights.  Make sure that a bedside light is easy to reach.  Do not use oversized bedding that drapes onto the floor.  Have a firm chair that has side arms to use for getting dressed.  Remove throw rugs and other tripping hazards from the floor. What can I do in the kitchen?  Clean up any spills right away.  Avoid walking on wet floors.  Place frequently used items in  easy-to-reach places.  If you need to reach for something above you, use a sturdy step stool that has a grab bar.  Keep electrical cables out of the way.  Do not use floor polish or wax that makes floors slippery. If you have to use wax, make sure that it is non-skid floor wax.  Remove throw rugs and other tripping hazards from the floor. What can I do in the stairways?  Do not leave any items on the stairs.  Make sure that there are handrails on both sides of the stairs. Fix handrails that are broken or loose. Make sure that handrails are as long as the stairways.  Check any carpeting to make sure that it is firmly attached to the stairs. Fix any carpet that is loose or worn.  Avoid having throw rugs at the top or bottom of stairways, or secure the rugs with carpet tape to prevent them from moving.  Make sure that you have a light switch at the top of the stairs and the bottom of the stairs. If you do not have them, have them installed. What are some other fall prevention tips?  Wear closed-toe shoes that fit well and support your feet. Wear shoes that have rubber soles or low heels.  When you use a stepladder, make sure that it is completely opened and that  the sides are firmly locked. Have someone hold the ladder while you are using it. Do not climb a closed stepladder.  Add color or contrast paint or tape to grab bars and handrails in your home. Place contrasting color strips on the first and last steps.  Use mobility aids as needed, such as canes, walkers, scooters, and crutches.  Turn on lights if it is dark. Replace any light bulbs that burn out.  Set up furniture so that there are clear paths. Keep the furniture in the same spot.  Fix any uneven floor surfaces.  Choose a carpet design that does not hide the edge of steps of a stairway.  Be aware of any and all pets.  Review your medicines with your healthcare provider. Some medicines can cause dizziness or changes in  blood pressure, which increase your risk of falling. Talk with your health care provider about other ways that you can decrease your risk of falls. This may include working with a physical therapist or trainer to improve your strength, balance, and endurance. This information is not intended to replace advice given to you by your health care provider. Make sure you discuss any questions you have with your health care provider. Document Released: 05/01/2002 Document Revised: 10/08/2015 Document Reviewed: 06/15/2014 Elsevier Interactive Patient Education  2017 [employer].

## 2016-09-11 LAB — PRIMIDONE, SERUM
PHENOBARBITAL, SERUM: 18 ug/mL (ref 15–40)
PRIMIDONE LVL: 13.5 ug/mL — AB (ref 5.0–12.0)

## 2016-09-11 LAB — VITAMIN B12: VITAMIN B 12: 372 pg/mL (ref 232–1245)

## 2016-09-11 LAB — RPR: RPR Ser Ql: NONREACTIVE

## 2016-09-13 ENCOUNTER — Telehealth: Payer: Self-pay | Admitting: Neurology

## 2016-09-13 NOTE — Telephone Encounter (Signed)
I called the patient. The blood work is OK, with the exception that the mysoline level is high, would recommend dropping the dose by 1/2 tablet a day. ?contributing factor in the gait instability.

## 2016-09-14 MED ORDER — PRIMIDONE 250 MG PO TABS: 125.0000 mg | ORAL_TABLET | Freq: Two times a day (BID) | ORAL | Status: AC

## 2016-09-14 NOTE — Telephone Encounter (Signed)
Patient called office in reference to primidone (MYSOLINE) 250 MG tablet.  Patient has questions in reference how to take the medication?   Is this medication being decreased?

## 2016-09-14 NOTE — Telephone Encounter (Signed)
I called patient. She currently is taking a full 250 mg of Mysoline in the morning and half of a 250 at night. She will cut back on medication taking one half tablet twice daily.  I am concerned that the Mysoline may be leading to some gait instability.

## 2016-09-14 NOTE — Addendum Note (Signed)
Addended by: Kathrynn Ducking on: 09/14/2016 05:05 PM   Modules accepted: Orders

## 2016-09-15 ENCOUNTER — Ambulatory Visit
Admission: RE | Admit: 2016-09-15 | Discharge: 2016-09-15 | Disposition: A | Discharge status: Home / Self Care | Payer: Medicare Other | Source: Ambulatory Visit | Attending: Neurology | Admitting: Neurology

## 2016-09-15 DIAGNOSIS — R269 Unspecified abnormalities of gait and mobility: Secondary | ICD-10-CM | POA: Diagnosis not present

## 2016-09-15 DIAGNOSIS — G250 Essential tremor: Secondary | ICD-10-CM

## 2016-09-16 ENCOUNTER — Telehealth: Payer: Self-pay | Admitting: Neurology

## 2016-09-16 NOTE — Telephone Encounter (Signed)
I called patient. MRI the brain shows some atrophy and mild small vessel disease, nothing that should be impacting the gait. The primidone dosing was lowered as the primidone level was in the toxic range. If this does not help the gait instability, the patient is to call our office.   MRI brain 09/16/16:  IMPRESSION:  Abnormal MRI brain (without) demonstrating: 1. Mild perisylvian and mesial temporal atrophy. 2. Mild periventricular, subcortical and pontine chronic small vessel ischemic disease.  3. No acute findings. 4. Compared to MRI on 05/20/12, there has been progression of atrophy and chronic small vessel ischemic disease.

## 2016-09-22 ENCOUNTER — Ambulatory Visit: Payer: Self-pay | Admitting: Neurology

## 2016-10-13 ENCOUNTER — Other Ambulatory Visit (HOSPITAL_COMMUNITY): Payer: Self-pay | Admitting: Nephrology

## 2016-10-13 DIAGNOSIS — I10 Essential (primary) hypertension: Secondary | ICD-10-CM

## 2016-10-26 ENCOUNTER — Ambulatory Visit (HOSPITAL_COMMUNITY)
Admission: RE | Admit: 2016-10-26 | Discharge: 2016-10-26 | Disposition: A | Discharge status: Home / Self Care | Payer: Medicare Other | Source: Ambulatory Visit | Attending: Cardiovascular Disease | Admitting: Cardiovascular Disease

## 2016-10-26 DIAGNOSIS — I774 Celiac artery compression syndrome: Secondary | ICD-10-CM | POA: Insufficient documentation

## 2016-10-26 DIAGNOSIS — I701 Atherosclerosis of renal artery: Secondary | ICD-10-CM

## 2016-10-26 DIAGNOSIS — I10 Essential (primary) hypertension: Secondary | ICD-10-CM

## 2016-10-26 DIAGNOSIS — E785 Hyperlipidemia, unspecified: Secondary | ICD-10-CM | POA: Diagnosis not present

## 2016-10-26 DIAGNOSIS — E119 Type 2 diabetes mellitus without complications: Secondary | ICD-10-CM | POA: Insufficient documentation

## 2016-10-30 ENCOUNTER — Telehealth: Payer: Self-pay

## 2016-10-30 NOTE — Telephone Encounter (Signed)
Pt called in to report that she has had some increased blood pressure issues for the past few weeks, despite taking bp medications. Pt states that she had gone to her pcp and was referred to a renal dr. Pt had US of the kidney done and was told that she had some mild blockage on her L ureter. The blockage was so small that no surgical intervention is needed at this time. His renal dr stopped and reduced some of her hypoglycemics medications and also suggested that pt contact Dr.Gudena's office to see if the anastrozole could be causing her bp to increase. Pt states that her average BP is 170-180 sys and 56'P diastolic. Advised pt to stop anastrozole for 1-2 weeks and see if her BP starts improving. Pt is to call the clinic in 1 week to give update on how she is doing. Suggested for pt to keep a log and check BP 2 to 3x per day to see if she is doing better with stopping anastrozole or not. Will notify Dr.Gudena if pt BP does improve with stopping anastrozole. PT verbalized understanding and will call next week to give [place] an update. No further questions or concerns at this time.

## 2017-01-13 ENCOUNTER — Ambulatory Visit: Payer: Medicare Other | Admitting: Neurology

## 2017-03-05 ENCOUNTER — Ambulatory Visit: Payer: Medicare Other | Admitting: Hematology and Oncology

## 2017-03-05 NOTE — Assessment & Plan Note (Deleted)
Left Lumpectomy 12/19/15: ILC ,2.3 cm Grade 1, ALH, ER 80%, PR 0%, Ki 67: 5% Staging: T2Nx(Stage 2A) Patient refused radiation therapy.  Current treatment: Adjuvant antiestrogen therapy with anastrozole 1 mg daily started 12/26/2015 Anastrozole toxicities: 1. Mild hot flashes 2. chronic musculoskeletal aches and pains but no different than prior to starting antiestrogen therapy.  Breast cancer surveillance:  1. Breast exam 03/05/2017: Benign    2. mammograms:   Osteopenia: Due for DEXA in 2019.  I recommended continued calcium with vitamin D along with weight bearing exercises.    Return to clinic in  1 year for follow-up.

## 2017-03-08 ENCOUNTER — Encounter (INDEPENDENT_AMBULATORY_CARE_PROVIDER_SITE_OTHER): Payer: Self-pay | Admitting: Orthopaedic Surgery

## 2017-03-08 ENCOUNTER — Ambulatory Visit (INDEPENDENT_AMBULATORY_CARE_PROVIDER_SITE_OTHER): Payer: Medicare Other | Admitting: Orthopaedic Surgery

## 2017-03-08 ENCOUNTER — Ambulatory Visit (INDEPENDENT_AMBULATORY_CARE_PROVIDER_SITE_OTHER): Payer: Medicare Other

## 2017-03-08 DIAGNOSIS — M25562 Pain in left knee: Secondary | ICD-10-CM

## 2017-03-08 MED ORDER — LIDOCAINE HCL 1 % IJ SOLN
2.0000 mL | INTRAMUSCULAR | Status: AC | PRN
  Administered 2017-03-08: 2 mL

## 2017-03-08 MED ORDER — BUPIVACAINE HCL 0.5 % IJ SOLN
2.0000 mL | INTRAMUSCULAR | Status: AC | PRN
  Administered 2017-03-08: 2 mL via INTRA_ARTICULAR

## 2017-03-08 MED ORDER — METHYLPREDNISOLONE ACETATE 40 MG/ML IJ SUSP
40.0000 mg | INTRAMUSCULAR | Status: AC | PRN
  Administered 2017-03-08: 40 mg via INTRA_ARTICULAR

## 2017-03-08 NOTE — Progress Notes (Signed)
Office Visit Note   Patient: Rhonda [name]           Date of Birth: 1932/10/20           MRN: [id number] Visit Date: 03/08/2017              Requested by: Derinda Late, MD [address] [place], [place] [address] PCP: Derinda Late, MD   Assessment & Plan: Visit Diagnoses:  1. Acute pain of left knee     Plan: Overall impression is left knee osteoarthritis flareup status post fall. Cortisone injection was performed today. Hinged knee brace for support. Questions encouraged and answered. Follow-up as needed.  Follow-Up Instructions: Return if symptoms worsen or fail to improve.   Orders:  Orders Placed This Encounter  Procedures  . XR KNEE 3 VIEW LEFT   No orders of the defined types were placed in this encounter.     Procedures: Large Joint Inj Date/Time: 03/08/2017 1:32 PM Performed by: Leandrew Koyanagi Authorized by: Leandrew Koyanagi   Consent Given by:  Patient Timeout: prior to procedure the correct patient, procedure, and site was verified   Indications:  Pain Location:  Knee Site:  L knee Prep: patient was prepped and draped in usual sterile fashion   Needle Size:  22 G Ultrasound Guidance: No   Fluoroscopic Guidance: No   Arthrogram: No   Medications:  2 mL lidocaine 1 %; 2 mL bupivacaine 0.5 %; 40 mg methylPREDNISolone acetate 40 MG/ML Patient tolerance:  Patient tolerated the procedure well with no immediate complications     Clinical Data: No additional findings.   Subjective: Chief Complaint  Patient presents with  . Left Knee - Pain, Edema    Patient is 83 year old female 3 week history of left knee pain status post fall. She is ambulating with a cane. She denies any numbness or tingling or radiation of pain. The pain is throughout her left knee without any specific significant mechanical symptoms. She has a throbbing pain that is worse with ambulation and better with rest.    Review of Systems  Constitutional: Negative.   HENT:  Negative.   Eyes: Negative.   Respiratory: Negative.   Cardiovascular: Negative.   Endocrine: Negative.   Musculoskeletal: Negative.   Neurological: Negative.   Hematological: Negative.   Psychiatric/Behavioral: Negative.   All other systems reviewed and are negative.    Objective: Vital Signs: There were no vitals taken for this visit.  Physical Exam  Constitutional: She is oriented to person, place, and time. She appears well-developed and well-nourished.  HENT:  Head: Normocephalic and atraumatic.  Eyes: EOM are normal.  Neck: Neck supple.  Pulmonary/Chest: Effort normal.  Abdominal: Soft.  Neurological: She is alert and oriented to person, place, and time.  Skin: Skin is warm. Capillary refill takes less than 2 seconds.  Psychiatric: She has a normal mood and affect. Her behavior is normal. Judgment and thought content normal.  Nursing note and vitals reviewed.   Ortho Exam Left knee exam shows no joint effusion. Collaterals and cruciates are stable. Patella tracking is normal. Specialty Comments:  No specialty comments available.  Imaging: No results found.   PMFS History: Patient Active Problem List   Diagnosis Date Noted  . Gait disorder 09/09/2016  . Tremor, essential 09/09/2016  . Breast cancer of upper-outer quadrant of left female breast ([facility]) 11/06/2015   Past Medical History:  Diagnosis Date  . Arthritis    fingers  . Benign essential tremor  on primadone  . Breast cancer of upper-outer quadrant of left female breast ([facility]) 11/06/2015  . Diabetes mellitus without complication ([facility])   . Gait disorder 09/09/2016  . GERD (gastroesophageal reflux disease)   . Hyperlipidemia   . Hypertension   . Pneumonia   . Tremor, essential 09/09/2016    Family History  Problem Relation Age of Onset  . ALS Mother   . Tremor Father   . Heart attack Father   . Tremor Sister   . Tremor Brother     Past Surgical History:  Procedure Laterality Date  . BREAST  LUMPECTOMY WITH RADIOACTIVE SEED LOCALIZATION Left 12/17/2015   Procedure: BREAST LUMPECTOMY WITH RADIOACTIVE SEED LOCALIZATION;  Surgeon: [name], MD;  Location: [facility];  Service: General;  Laterality: Left;  . LEG SURGERY Left    for fracture  . TUBAL LIGATION    . WRIST SURGERY Left    Social History   Occupational History  . Not on file.   Social History Main Topics  . Smoking status: Never Smoker  . Smokeless tobacco: Never Used  . Alcohol use Yes     Comment: social  . Drug use: No  . Sexual activity: Not on file

## 2017-03-09 ENCOUNTER — Telehealth: Payer: Self-pay | Admitting: Hematology and Oncology

## 2017-03-09 NOTE — Telephone Encounter (Signed)
Spoke with patient and rescheduled appt per 10.15 sch msg

## 2017-03-12 ENCOUNTER — Telehealth (INDEPENDENT_AMBULATORY_CARE_PROVIDER_SITE_OTHER): Payer: Self-pay | Admitting: Orthopaedic Surgery

## 2017-03-12 NOTE — Telephone Encounter (Signed)
Patient called stating that she received a Cortizone shot in her knee Monday and she is experiencing a lot of pain and discomfort in that knee and was wanting to speak with you and ask you a few questions. If you could give her a call back at [phone]

## 2017-03-12 NOTE — Telephone Encounter (Signed)
Patient aware to take Tylenol, use her can, and ice. If she continues to have this pain to come in first thing next week to see [facility]

## 2017-03-12 NOTE — Telephone Encounter (Signed)
Please give her a call to see what's going on.  Sounds like a steroid injection flare up.

## 2017-03-15 ENCOUNTER — Telehealth (INDEPENDENT_AMBULATORY_CARE_PROVIDER_SITE_OTHER): Payer: Self-pay

## 2017-03-15 ENCOUNTER — Ambulatory Visit (INDEPENDENT_AMBULATORY_CARE_PROVIDER_SITE_OTHER): Payer: Medicare Other | Admitting: Orthopaedic Surgery

## 2017-03-15 ENCOUNTER — Encounter (INDEPENDENT_AMBULATORY_CARE_PROVIDER_SITE_OTHER): Payer: Self-pay | Admitting: Orthopaedic Surgery

## 2017-03-15 DIAGNOSIS — M25562 Pain in left knee: Secondary | ICD-10-CM

## 2017-03-15 DIAGNOSIS — G8929 Other chronic pain: Secondary | ICD-10-CM

## 2017-03-15 NOTE — Telephone Encounter (Signed)
Per Dr Erlinda Hong he would like patient to f/u in 1 week. Called to make appt but they will call us back to schedule appt. They will check their calendar and call us back.

## 2017-03-15 NOTE — Progress Notes (Signed)
Office Visit Note   Patient: Rhonda [name]           Date of Birth: May 28, 1932           MRN: [id number] Visit Date: 03/15/2017              Requested by: Derinda Late, MD [address] [place], [place] [address] PCP: Derinda Late, MD   Assessment & Plan: Visit Diagnoses:  1. Chronic pain of left knee     Plan: MRI of the left knee to rule out occult fracture given failure of response to the cortisone injection and recent fall.  Follow-up in 1 week  Follow-Up Instructions: Return in about 1 week (around 03/22/2017).   Orders:  Orders Placed This Encounter  Procedures  . MR Knee Left w/o contrast   No orders of the defined types were placed in this encounter.     Procedures: No procedures performed   Clinical Data: No additional findings.   Subjective: Chief Complaint  Patient presents with  . Left Knee - Pain    Patient comes back today for continued left knee pain status post cortisone injection which has not given her significant relief.  She says that the injection has actually made this worse.  She denies any numbness and tingling.  She has increased pain when weightbearing and getting out of a chair.  She is taking Tylenol arthritis.  Denies any numbness and tingling.    Review of Systems  Constitutional: Negative.   HENT: Negative.   Eyes: Negative.   Respiratory: Negative.   Cardiovascular: Negative.   Endocrine: Negative.   Musculoskeletal: Negative.   Neurological: Negative.   Hematological: Negative.   Psychiatric/Behavioral: Negative.   All other systems reviewed and are negative.    Objective: Vital Signs: There were no vitals taken for this visit.  Physical Exam  Constitutional: She is oriented to person, place, and time. She appears well-developed and well-nourished.  Pulmonary/Chest: Effort normal.  Neurological: She is alert and oriented to person, place, and time.  Skin: Skin is warm. Capillary refill takes less than  2 seconds.  Psychiatric: She has a normal mood and affect. Her behavior is normal. Judgment and thought content normal.  Nursing note and vitals reviewed.   Ortho Exam Left knee exam shows no joint effusion.  Collaterals and cruciates are stable. Specialty Comments:  No specialty comments available.  Imaging: No results found.   PMFS History: Patient Active Problem List   Diagnosis Date Noted  . Gait disorder 09/09/2016  . Tremor, essential 09/09/2016  . Breast cancer of upper-outer quadrant of left female breast ([place]) 11/06/2015   Past Medical History:  Diagnosis Date  . Arthritis    fingers  . Benign essential tremor    on primadone  . Breast cancer of upper-outer quadrant of left female breast ([place]) 11/06/2015  . Diabetes mellitus without complication ([place])   . Gait disorder 09/09/2016  . GERD (gastroesophageal reflux disease)   . Hyperlipidemia   . Hypertension   . Pneumonia   . Tremor, essential 09/09/2016    Family History  Problem Relation Age of Onset  . ALS Mother   . Tremor Father   . Heart attack Father   . Tremor Sister   . Tremor Brother     Past Surgical History:  Procedure Laterality Date  . BREAST LUMPECTOMY WITH RADIOACTIVE SEED LOCALIZATION Left 12/17/2015   Procedure: BREAST LUMPECTOMY WITH RADIOACTIVE SEED LOCALIZATION;  Surgeon: [name], MD;  Location:  [facility];  Service: General;  Laterality: Left;  . LEG SURGERY Left    for fracture  . TUBAL LIGATION    . WRIST SURGERY Left    Social History   Occupational History  . Not on file.   Social History Main Topics  . Smoking status: Never Smoker  . Smokeless tobacco: Never Used  . Alcohol use Yes     Comment: social  . Drug use: No  . Sexual activity: Not on file

## 2017-03-22 ENCOUNTER — Ambulatory Visit (HOSPITAL_BASED_OUTPATIENT_CLINIC_OR_DEPARTMENT_OTHER): Payer: Medicare Other | Admitting: Hematology and Oncology

## 2017-03-22 ENCOUNTER — Ambulatory Visit (INDEPENDENT_AMBULATORY_CARE_PROVIDER_SITE_OTHER): Payer: Medicare Other | Admitting: Orthopaedic Surgery

## 2017-03-22 ENCOUNTER — Telehealth: Payer: Self-pay | Admitting: Hematology and Oncology

## 2017-03-22 DIAGNOSIS — Z79811 Long term (current) use of aromatase inhibitors: Secondary | ICD-10-CM

## 2017-03-22 DIAGNOSIS — C50412 Malignant neoplasm of upper-outer quadrant of left female breast: Secondary | ICD-10-CM

## 2017-03-22 DIAGNOSIS — Z170 Estrogen receptor positive status [ER+]: Secondary | ICD-10-CM | POA: Diagnosis not present

## 2017-03-22 MED ORDER — PRAVASTATIN SODIUM 40 MG PO TABS: 40.0000 mg | ORAL_TABLET | Freq: Every day | ORAL | 0 refills | Status: AC

## 2017-03-22 MED ORDER — ANASTROZOLE 1 MG PO TABS: 1.0000 mg | ORAL_TABLET | Freq: Every day | ORAL | 3 refills | Status: DC

## 2017-03-22 MED ORDER — ALLOPURINOL 100 MG PO TABS: 100.0000 mg | ORAL_TABLET | Freq: Every day | ORAL | Status: AC

## 2017-03-22 MED ORDER — SITAGLIPTIN PHOSPHATE 50 MG PO TABS: 100.0000 mg | ORAL_TABLET | Freq: Every day | ORAL | Status: DC

## 2017-03-22 MED ORDER — RANITIDINE HCL 150 MG PO TABS
150.0000 mg | ORAL_TABLET | Freq: Two times a day (BID) | ORAL | Status: DC
Start: 2017-03-22 — End: 2019-03-24

## 2017-03-22 NOTE — Assessment & Plan Note (Signed)
Left Lumpectomy 12/19/15: ILC ,2.3 cm Grade 1, ALH, ER 80%, PR 0%, Ki 67: 5% Staging: T2Nx(Stage 2A) Patient refused radiation therapy.  Current treatment: Adjuvant antiestrogen therapy with anastrozole 1 mg daily started 12/26/2015  Anastrozole toxicities: 1. Mild hot flashes 2. chronic musculoskeletal aches and pains but no different than prior to starting antiestrogen therapy.  Breast cancer surveillance:  1.  Breast exam 03/22/2017: Benign 2. mammograms at [facility]: Benign  Return to clinic in 1 year for follow-up.

## 2017-03-22 NOTE — Telephone Encounter (Signed)
Gave patient avs and calendar with appts per 10/29 los. °

## 2017-03-22 NOTE — Progress Notes (Signed)
Patient Care Team: Derinda Late, MD as PCP - General (Family Medicine) [name], MD as Consulting Physician (General Surgery) Nicholas Lose, MD as Consulting Physician (Hematology and Oncology) Gery Pray, MD as Consulting Physician (Radiation Oncology)  DIAGNOSIS:  Encounter Diagnosis  Name Primary?  . Malignant neoplasm of upper-outer quadrant of left breast in female, estrogen receptor positive ([place])     SUMMARY OF ONCOLOGIC HISTORY:   Breast cancer of upper-outer quadrant of left female breast ([place])   11/04/2015 Initial Diagnosis    Left breast biopsy 2:00 position: Invasive lobular cancer grade 1, ER 80%, PR 0%, Ki-67 5%, HER-2 negative ratio 1.19, screening detected left breast mass 1.6 cm, T1 cN0 stage IA clinical stage      12/17/2015 Surgery    Left Lumpectomy [name]): ILC, 2.3 cm, Grade 1, ALH, ER 80%, PR 0%, Ki 67: 5%, HER-2 negative (1.19 ratio).      01/06/2016 -  Anti-estrogen oral therapy    Anastrozole 1 mg by mouth daily 5 years       CHIEF COMPLIANT: Follow-up on anastrozole therapy  INTERVAL HISTORY: Rhonda [name] is a 83 year old with above-mentioned his left breast cancer underwent lumpectomy and is currently on anastrozole therapy.  She appears to be tolerating anastrozole fairly well without any major problems.  She has occasional hot flashes.  Her biggest complaint is severe pain in the left knee associated with swelling and discomfort.  She underwent injections and since then her knee has been feeling slightly better.  She [facility] which showed medial and lateral meniscus tears along with lots of swelling and fluid accumulation of the knee joint.  REVIEW OF SYSTEMS:   Constitutional: Denies fevers, chills or abnormal weight loss Eyes: Denies blurriness of vision Ears, nose, mouth, throat, and face: Denies mucositis or sore throat Respiratory: Denies cough, dyspnea or wheezes Cardiovascular: Denies  palpitation, chest discomfort Gastrointestinal:  Denies nausea, heartburn or change in bowel habits Skin: Denies abnormal skin rashes Lymphatics: Denies new lymphadenopathy or easy bruising Neurological:Denies numbness, tingling or new weaknesses Behavioral/Psych: Mood is stable, no new changes  Extremities: Left knee swelling and pain  All other systems were reviewed with the patient and are negative.  I have reviewed the past medical history, past surgical history, social history and family history with the patient and they are unchanged from previous note.  ALLERGIES:  is allergic to codeine.  MEDICATIONS:  Current Outpatient Prescriptions  Medication Sig Dispense Refill  . anastrozole (ARIMIDEX) 1 MG tablet Take 1 tablet (1 mg total) by mouth daily. 90 tablet 3  . aspirin 81 MG tablet Take 81 mg by mouth daily.    . Calcium Carbonate-Vitamin D (CALTRATE 600+D PO) Take 2 tablets by mouth daily.    . metFORMIN (GLUCOPHAGE-XR) 500 MG 24 hr tablet Take 1,000 mg by mouth 2 (two) times daily.    . metoprolol succinate (TOPROL-XL) 50 MG 24 hr tablet Take 50 mg by mouth daily. Take with or immediately following a meal.    . Multiple Vitamins-Minerals (MULTIVITAMIN & MINERAL PO) Take 1 tablet by mouth every morning.    Marland Kitchen omeprazole (PRILOSEC) 20 MG capsule     . pravastatin (PRAVACHOL) 80 MG tablet Take 80 mg by mouth at bedtime.    . primidone (MYSOLINE) 250 MG tablet Take 0.5 tablets (125 mg total) by mouth 2 (two) times daily.    Marland Kitchen rOPINIRole (REQUIP) 1 MG tablet Take 1.5 mg by mouth at bedtime.    Marland Kitchen  sitaGLIPtin (JANUVIA) 100 MG tablet Take 100 mg by mouth daily.     No current facility-administered medications for this visit.     PHYSICAL EXAMINATION: ECOG PERFORMANCE STATUS: 1 - Symptomatic but completely ambulatory  Vitals:   03/22/17 1345  BP: (!) 153/49  Pulse: 62  Resp: 18  Temp: (!) 97.5 F (36.4 C)  SpO2: 97%   Filed Weights   03/22/17 1345  Weight: 162 lb 9.6 oz  (73.8 kg)    GENERAL:alert, no distress and comfortable SKIN: skin color, texture, turgor are normal, no rashes or significant lesions EYES: normal, Conjunctiva are pink and non-injected, sclera clear OROPHARYNX:no exudate, no erythema and lips, buccal mucosa, and tongue normal  NECK: supple, thyroid normal size, non-tender, without nodularity LYMPH:  no palpable lymphadenopathy in the cervical, axillary or inguinal LUNGS: clear to auscultation and percussion with normal breathing effort HEART: regular rate & rhythm and no murmurs and no lower extremity edema ABDOMEN:abdomen soft, non-tender and normal bowel sounds MUSCULOSKELETAL:no cyanosis of digits and no clubbing  NEURO: alert & oriented x 3 with fluent speech, no focal motor/sensory deficits EXTREMITIES: No lower extremity edema, left knee swelling and pain BREAST: No palpable masses or nodules in either right or left breasts. No palpable axillary supraclavicular or infraclavicular adenopathy no breast tenderness or nipple discharge. (exam performed in the presence of a chaperone)  LABORATORY DATA:  I have reviewed the data as listed   Chemistry      Component Value Date/Time   NA 134 (L) 12/16/2015 0925   NA 130 (L) 11/13/2015 1221   K 4.7 12/16/2015 0925   K 4.9 11/13/2015 1221   CL 98 (L) 12/16/2015 0925   CO2 28 12/16/2015 0925   CO2 23 11/13/2015 1221   BUN 23 (H) 12/16/2015 0925   BUN 22.0 11/13/2015 1221   CREATININE 1.03 (H) 12/16/2015 0925   CREATININE 1.2 (H) 11/13/2015 1221      Component Value Date/Time   CALCIUM 9.2 12/16/2015 0925   CALCIUM 9.3 11/13/2015 1221   ALKPHOS 66 11/13/2015 1221   AST 24 11/13/2015 1221   ALT 31 11/13/2015 1221   BILITOT <0.30 11/13/2015 1221       Lab Results  Component Value Date   WBC 6.3 11/13/2015   HGB 11.6 11/13/2015   HCT 35.1 11/13/2015   MCV 88.8 11/13/2015   PLT 191 11/13/2015   NEUTROABS 4.8 11/13/2015    ASSESSMENT & PLAN:  Breast cancer of upper-outer  quadrant of left female breast ([place]) Left Lumpectomy 12/19/15: ILC ,2.3 cm Grade 1, ALH, ER 80%, PR 0%, Ki 67: 5% Staging: T2Nx(Stage 2A) Patient refused radiation therapy.  Current treatment: Adjuvant antiestrogen therapy with anastrozole 1 mg daily started 12/26/2015  Anastrozole toxicities: 1. Mild hot flashes 2. chronic musculoskeletal aches and pains but no different than prior to starting antiestrogen therapy.  Breast cancer surveillance:  1.  Breast exam 03/22/2017: Benign 2. mammograms at [facility]: Benign  Left knee swelling with medial and lateral meniscus tears and tearing of the gastrocnemius tendon Patient will be seeing orthopedics tomorrow and she will know what needs to be done about it.  Instructed her to hold anastrozole for 2-3 days before and a week after.  Return to clinic in 1 year for follow-up.   I spent 25 minutes talking to the patient of which more than half was spent in counseling and coordination of care.  No orders of the defined types were placed in this encounter.  The patient  has a good understanding of the overall plan. she agrees with it. she will call with any problems that may develop before the next visit here.   Rulon Eisenmenger, MD 03/22/17

## 2017-03-23 ENCOUNTER — Ambulatory Visit (INDEPENDENT_AMBULATORY_CARE_PROVIDER_SITE_OTHER): Payer: Medicare Other | Admitting: Orthopaedic Surgery

## 2017-03-23 DIAGNOSIS — M23301 Other meniscus derangements, unspecified lateral meniscus, left knee: Secondary | ICD-10-CM | POA: Diagnosis not present

## 2017-03-23 NOTE — Progress Notes (Signed)
   Office Visit Note   Patient: Milayah Krell           Date of Birth: [birth year]           MRN: [id number] Visit Date: 03/23/2017              Requested by: Derinda Late, MD [address] [place], [place] [address] PCP: Derinda Late, MD   Assessment & Plan: Visit Diagnoses:  1. Degenerative tear of lateral meniscus of left knee     Plan: MRI findings consistent with a degenerative tear of the lateral meniscus with reactive bony edema of the lateral femoral condyle.  Clinically speaking she is doing better.  I would expect this to continue to improve over the next couple months.  Questions encouraged and answered.  Follow-up as needed.  Recommend symptomatic treatment as needed.  Follow-Up Instructions: Return if symptoms worsen or fail to improve.   Orders:  No orders of the defined types were placed in this encounter.  No orders of the defined types were placed in this encounter.     Procedures: No procedures performed   Clinical Data: No additional findings.   Subjective: No chief complaint on file.   Patient follows up today for review of her left knee MRI.  She is overall doing better.  She is able to walk better.    Review of Systems   Objective: Vital Signs: There were no vitals taken for this visit.  Physical Exam  Ortho Exam Left knee exam shows no joint effusion. Specialty Comments:  No specialty comments available.  Imaging: No results found.   PMFS History: Patient Active Problem List   Diagnosis Date Noted  . Degenerative tear of lateral meniscus of left knee 03/23/2017  . Gait disorder 09/09/2016  . Tremor, essential 09/09/2016  . Breast cancer of upper-outer quadrant of left female breast ([place]) 11/06/2015   Past Medical History:  Diagnosis Date  . Arthritis    fingers  . Benign essential tremor    on primadone  . Breast cancer of upper-outer quadrant of left female breast ([place]) 11/06/2015  . Diabetes mellitus without  complication ([place])   . Gait disorder 09/09/2016  . GERD (gastroesophageal reflux disease)   . Hyperlipidemia   . Hypertension   . Pneumonia   . Tremor, essential 09/09/2016    Family History  Problem Relation Age of Onset  . ALS Mother   . Tremor Father   . Heart attack Father   . Tremor Sister   . Tremor Brother     Past Surgical History:  Procedure Laterality Date  . BREAST LUMPECTOMY WITH RADIOACTIVE SEED LOCALIZATION Left 12/17/2015   Procedure: BREAST LUMPECTOMY WITH RADIOACTIVE SEED LOCALIZATION;  Surgeon: [name], MD;  Location: [facility];  Service: General;  Laterality: Left;  . LEG SURGERY Left    for fracture  . TUBAL LIGATION    . WRIST SURGERY Left    Social History   Occupational History  . Not on file.   Social History Main Topics  . Smoking status: Never Smoker  . Smokeless tobacco: Never Used  . Alcohol use Yes     Comment: social  . Drug use: No  . Sexual activity: Not on file

## 2017-04-10 IMAGING — CR DG LUMBAR SPINE COMPLETE 4+V
5 series · 5 of 5 positions shown · non-contrast
Comparison: None.

CLINICAL DATA: Chronic right SI joint pain and low back pain for 2
weeks.

EXAM:
LUMBAR SPINE - COMPLETE 4+ VIEW

[w lumbar spine ap]
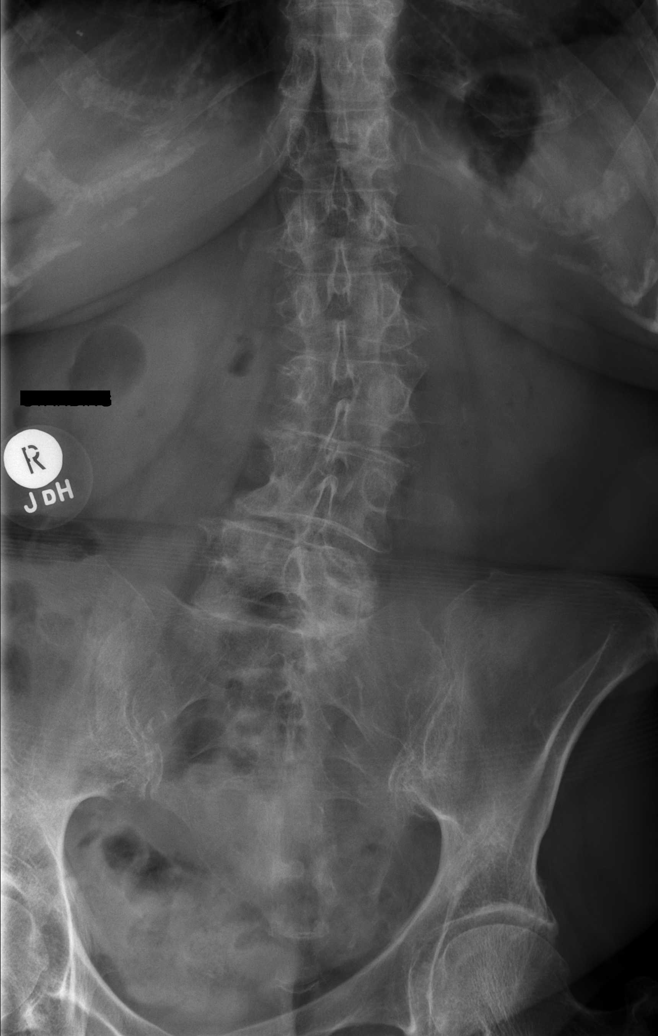

[w lumbar spine obl (1 of 2)]
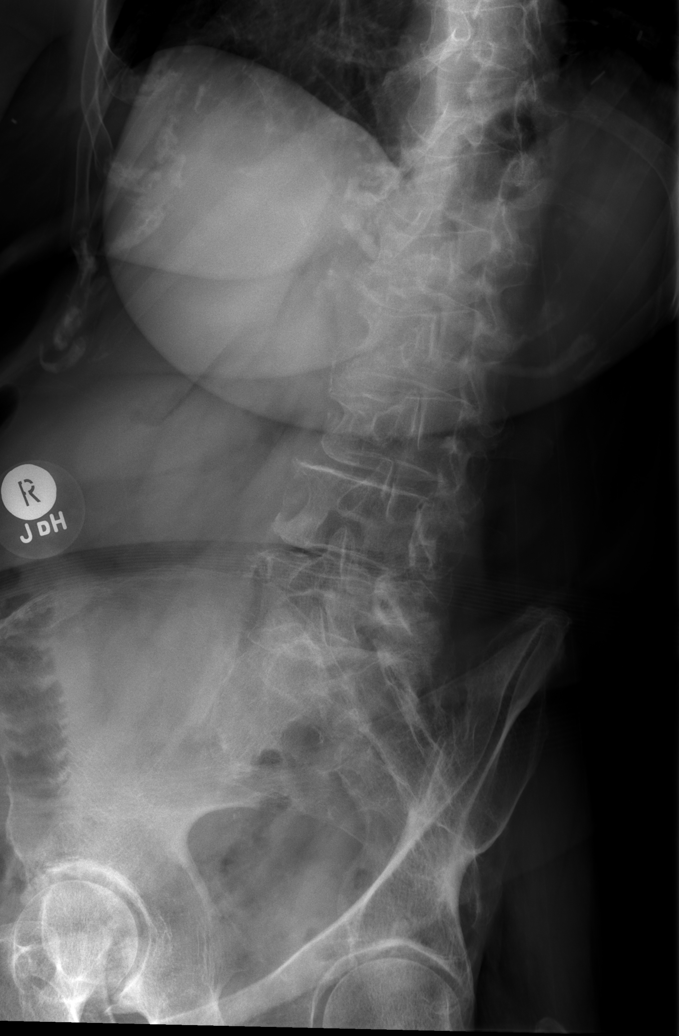

[w lumbar spine obl (2 of 2)]
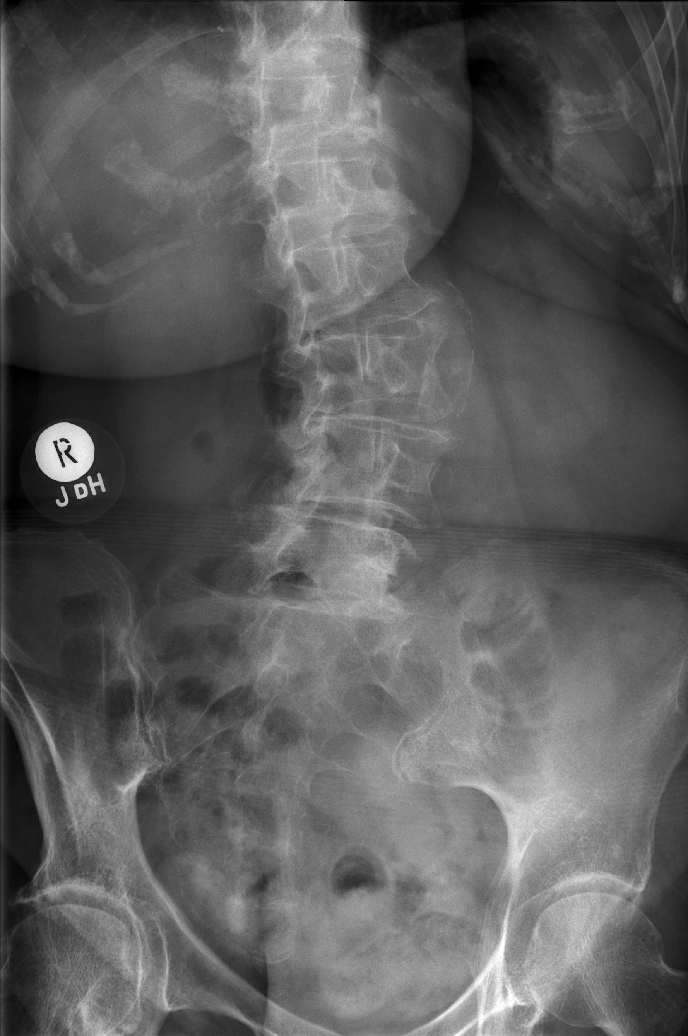

[w lumbar spine lat]
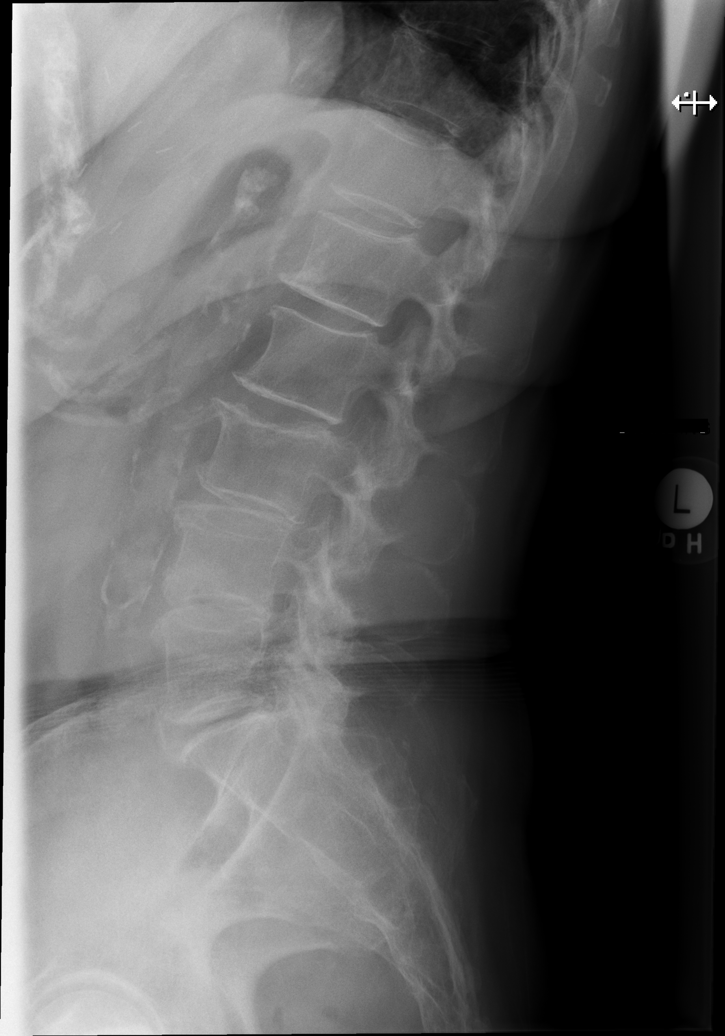

[w lumbar l-5 s-1 spot]
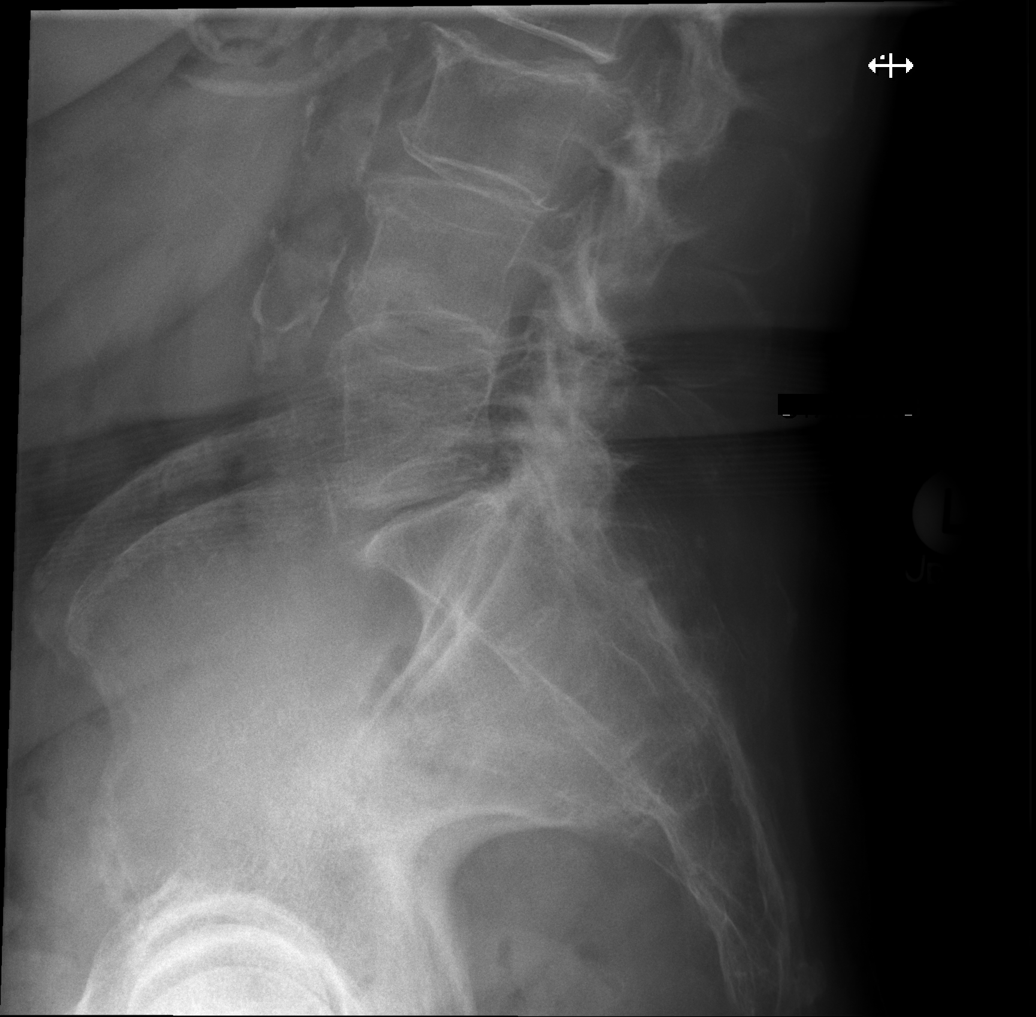

[5 of 5 positions shown; findings below may reference images not displayed]

FINDINGS: Levoscoliosis in the lumbar spine. Diffuse degenerative disc and
facet disease. No fracture or subluxation. Aortoiliac calcifications
without visible aneurysm.
IMPRESSION: Levoscoliosis and moderate to advanced degenerative disc and facet
disease. No acute findings.

## 2017-06-30 ENCOUNTER — Telehealth: Payer: Self-pay | Admitting: Hematology and Oncology

## 2017-06-30 NOTE — Telephone Encounter (Signed)
Scheduled appt per 2/6 sch msg - spoke with patient's husband regarding appts.

## 2017-07-06 ENCOUNTER — Telehealth: Payer: Self-pay | Admitting: Hematology and Oncology

## 2017-07-06 ENCOUNTER — Inpatient Hospital Stay: Payer: Medicare Other

## 2017-07-06 ENCOUNTER — Inpatient Hospital Stay: Payer: Medicare Other | Attending: Hematology and Oncology | Admitting: Hematology and Oncology

## 2017-07-06 ENCOUNTER — Ambulatory Visit (HOSPITAL_COMMUNITY)
Admission: RE | Admit: 2017-07-06 | Discharge: 2017-07-06 | Disposition: A | Discharge status: Home / Self Care | Payer: Medicare Other | Source: Ambulatory Visit | Attending: Hematology and Oncology | Admitting: Hematology and Oncology

## 2017-07-06 DIAGNOSIS — M19012 Primary osteoarthritis, left shoulder: Secondary | ICD-10-CM | POA: Diagnosis not present

## 2017-07-06 DIAGNOSIS — M5134 Other intervertebral disc degeneration, thoracic region: Secondary | ICD-10-CM | POA: Insufficient documentation

## 2017-07-06 DIAGNOSIS — Z79811 Long term (current) use of aromatase inhibitors: Secondary | ICD-10-CM | POA: Insufficient documentation

## 2017-07-06 DIAGNOSIS — M5030 Other cervical disc degeneration, unspecified cervical region: Secondary | ICD-10-CM | POA: Insufficient documentation

## 2017-07-06 DIAGNOSIS — C50412 Malignant neoplasm of upper-outer quadrant of left female breast: Secondary | ICD-10-CM

## 2017-07-06 DIAGNOSIS — R937 Abnormal findings on diagnostic imaging of other parts of musculoskeletal system: Secondary | ICD-10-CM | POA: Insufficient documentation

## 2017-07-06 DIAGNOSIS — M19011 Primary osteoarthritis, right shoulder: Secondary | ICD-10-CM | POA: Insufficient documentation

## 2017-07-06 DIAGNOSIS — R768 Other specified abnormal immunological findings in serum: Secondary | ICD-10-CM | POA: Diagnosis not present

## 2017-07-06 DIAGNOSIS — N189 Chronic kidney disease, unspecified: Secondary | ICD-10-CM | POA: Diagnosis not present

## 2017-07-06 DIAGNOSIS — I129 Hypertensive chronic kidney disease with stage 1 through stage 4 chronic kidney disease, or unspecified chronic kidney disease: Secondary | ICD-10-CM | POA: Diagnosis not present

## 2017-07-06 DIAGNOSIS — N951 Menopausal and female climacteric states: Secondary | ICD-10-CM | POA: Insufficient documentation

## 2017-07-06 DIAGNOSIS — Z79899 Other long term (current) drug therapy: Secondary | ICD-10-CM | POA: Insufficient documentation

## 2017-07-06 DIAGNOSIS — Z170 Estrogen receptor positive status [ER+]: Secondary | ICD-10-CM

## 2017-07-06 DIAGNOSIS — M5136 Other intervertebral disc degeneration, lumbar region: Secondary | ICD-10-CM | POA: Diagnosis not present

## 2017-07-06 DIAGNOSIS — M170 Bilateral primary osteoarthritis of knee: Secondary | ICD-10-CM | POA: Insufficient documentation

## 2017-07-06 LAB — CBC WITH DIFFERENTIAL (CANCER CENTER ONLY)
Basophils Absolute: 0 10*3/uL (ref 0.0–0.1)
Basophils Relative: 1 %
EOS PCT: 2 %
Eosinophils Absolute: 0.1 10*3/uL (ref 0.0–0.5)
HCT: 33.9 % — ABNORMAL LOW (ref 34.8–46.6)
Hemoglobin: 11.3 g/dL — ABNORMAL LOW (ref 11.6–15.9)
LYMPHS ABS: 1.5 10*3/uL (ref 0.9–3.3)
LYMPHS PCT: 21 %
MCH: 32.1 pg (ref 25.1–34.0)
MCHC: 33.3 g/dL (ref 31.5–36.0)
MCV: 96.2 fL (ref 79.5–101.0)
MONO ABS: 1 10*3/uL — AB (ref 0.1–0.9)
MONOS PCT: 15 %
Neutro Abs: 4.2 10*3/uL (ref 1.5–6.5)
Neutrophils Relative %: 61 %
PLATELETS: 210 10*3/uL (ref 145–400)
RBC: 3.52 MIL/uL — AB (ref 3.70–5.45)
RDW: 13.5 % (ref 11.2–14.5)
WBC Count: 6.9 10*3/uL (ref 3.9–10.3)

## 2017-07-06 LAB — CMP (CANCER CENTER ONLY)
ALBUMIN: 3.8 g/dL (ref 3.5–5.0)
ALT: 21 U/L (ref 0–55)
AST: 23 U/L (ref 5–34)
Alkaline Phosphatase: 105 U/L (ref 40–150)
Anion gap: 12 — ABNORMAL HIGH (ref 3–11)
BUN: 33 mg/dL — AB (ref 7–26)
CHLORIDE: 99 mmol/L (ref 98–109)
CO2: 28 mmol/L (ref 22–29)
CREATININE: 2.32 mg/dL — AB (ref 0.60–1.10)
Calcium: 9.7 mg/dL (ref 8.4–10.4)
GFR, Est AFR Am: 21 mL/min — ABNORMAL LOW (ref >60)
GFR, Estimated: 18 mL/min — ABNORMAL LOW (ref >60)
GLUCOSE: 81 mg/dL (ref 70–140)
POTASSIUM: 4.3 mmol/L (ref 3.5–5.1)
Sodium: 139 mmol/L (ref 136–145)
Total Bilirubin: 0.3 mg/dL (ref 0.2–1.2)
Total Protein: 7.9 g/dL (ref 6.4–8.3)

## 2017-07-06 LAB — C-REACTIVE PROTEIN: CRP: 0.9 mg/dL (ref <1.0)

## 2017-07-06 NOTE — Progress Notes (Signed)
Patient Care Team: Derinda Late, MD as PCP - General (Family Medicine) [name], MD as Consulting Physician (General Surgery) Nicholas Lose, MD as Consulting Physician (Hematology and Oncology) Gery Pray, MD as Consulting Physician (Radiation Oncology)  DIAGNOSIS:  Encounter Diagnoses  Name Primary?  . Malignant neoplasm of upper-outer quadrant of left breast in female, estrogen receptor positive ([place])   . Elevated serum immunoglobulin free light chains     SUMMARY OF ONCOLOGIC HISTORY:   Breast cancer of upper-outer quadrant of left female breast ([place])   11/04/2015 Initial Diagnosis    Left breast biopsy 2:00 position: Invasive lobular cancer grade 1, ER 80%, PR 0%, Ki-67 5%, HER-2 negative ratio 1.19, screening detected left breast mass 1.6 cm, T1 cN0 stage IA clinical stage      12/17/2015 Surgery    Left Lumpectomy [name]): ILC, 2.3 cm, Grade 1, ALH, ER 80%, PR 0%, Ki 67: 5%, HER-2 negative (1.19 ratio).      01/06/2016 -  Anti-estrogen oral therapy    Anastrozole 1 mg by mouth daily 5 years       CHIEF COMPLIANT: Follow-up to discuss the recently found elevation of serum kappa light chains  INTERVAL HISTORY: Rhonda [name] is a 84 year old with above-mentioned history of left breast cancer who was recently noted to have hypertension and chronic kidney disease.  She had workup by nephrology that revealed elevated serum free light chains with the Being the one markedly being elevated.  She did not have an elevated monoclonal protein.  She was referred to [place] for discussion and workup of the elevated light chains. Patient tells me that these problems started only since anastrozole was began in August  REVIEW OF SYSTEMS:   Constitutional: Denies fevers, chills or abnormal weight loss Eyes: Denies blurriness of vision Ears, nose, mouth, throat, and face: Denies mucositis or sore throat Respiratory: Denies cough, dyspnea or wheezes Cardiovascular: Denies  palpitation, chest discomfort Gastrointestinal:  Denies nausea, heartburn or change in bowel habits Skin: Denies abnormal skin rashes Lymphatics: Denies new lymphadenopathy or easy bruising Neurological:Denies numbness, tingling or new weaknesses Behavioral/Psych: Mood is stable, no new changes  Extremities: No lower extremity edema Breast:  denies any pain or lumps or nodules in either breasts All other systems were reviewed with the patient and are negative.  I have reviewed the past medical history, past surgical history, social history and family history with the patient and they are unchanged from previous note.  ALLERGIES:  is allergic to codeine.  MEDICATIONS:  Current Outpatient Medications  Medication Sig Dispense Refill  . allopurinol (ZYLOPRIM) 100 MG tablet Take 1 tablet (100 mg total) by mouth daily.    Marland Kitchen anastrozole (ARIMIDEX) 1 MG tablet Take 1 tablet (1 mg total) by mouth daily. 90 tablet 3  . aspirin 81 MG tablet Take 81 mg by mouth daily.    . Calcium Carbonate-Vitamin D (CALTRATE 600+D PO) Take 2 tablets by mouth daily.    . metoprolol succinate (TOPROL-XL) 50 MG 24 hr tablet Take 50 mg by mouth daily. Take with or immediately following a meal.    . Multiple Vitamins-Minerals (MULTIVITAMIN & MINERAL PO) Take 1 tablet by mouth every morning.    . pravastatin (PRAVACHOL) 40 MG tablet Take 1 tablet (40 mg total) by mouth at bedtime. 30 tablet 0  . primidone (MYSOLINE) 250 MG tablet Take 0.5 tablets (125 mg total) by mouth 2 (two) times daily.    . ranitidine (ZANTAC) 150 MG tablet Take 1 tablet (150  mg total) by mouth 2 (two) times daily.    Marland Kitchen rOPINIRole (REQUIP) 1 MG tablet Take 1.5 mg by mouth at bedtime.    . sitaGLIPtin (JANUVIA) 50 MG tablet Take 2 tablets (100 mg total) by mouth daily.     No current facility-administered medications for this visit.     PHYSICAL EXAMINATION: ECOG PERFORMANCE STATUS: 0 - Asymptomatic  Vitals:   07/06/17 1225  BP: (!) 181/71    Pulse: 75  Resp: 16  Temp: 98.3 F (36.8 C)  SpO2: 99%   Filed Weights   07/06/17 1225  Weight: 160 lb (72.6 kg)    GENERAL:alert, no distress and comfortable SKIN: skin color, texture, turgor are normal, no rashes or significant lesions EYES: normal, Conjunctiva are pink and non-injected, sclera clear OROPHARYNX:no exudate, no erythema and lips, buccal mucosa, and tongue normal  NECK: supple, thyroid normal size, non-tender, without nodularity LYMPH:  no palpable lymphadenopathy in the cervical, axillary or inguinal LUNGS: clear to auscultation and percussion with normal breathing effort HEART: regular rate & rhythm and no murmurs and no lower extremity edema ABDOMEN:abdomen soft, non-tender and normal bowel sounds MUSCULOSKELETAL:no cyanosis of digits and no clubbing  NEURO: alert & oriented x 3 with fluent speech, no focal motor/sensory deficits EXTREMITIES: No lower extremity edema BREAST: No palpable masses or nodules in either right or left breasts. No palpable axillary supraclavicular or infraclavicular adenopathy no breast tenderness or nipple discharge. (exam performed in the presence of a chaperone)  LABORATORY DATA:  I have reviewed the data as listed CMP Latest Ref Rng & Units 12/16/2015 11/13/2015 09/27/2014  Glucose 65 - 99 mg/dL 137(H) 121 170(H)  BUN 6 - 20 mg/dL 23(H) 22.0 21(H)  Creatinine 0.44 - 1.00 mg/dL 1.03(H) 1.2(H) 1.04(H)  Sodium 135 - 145 mmol/L 134(L) 130(L) 137  Potassium 3.5 - 5.1 mmol/L 4.7 4.9 4.5  Chloride 101 - 111 mmol/L 98(L) - 107  CO2 22 - 32 mmol/L 28 23 22   Calcium 8.9 - 10.3 mg/dL 9.2 9.3 8.3(L)  Total Protein 6.4 - 8.3 g/dL - 7.9 6.8  Total Bilirubin 0.20 - 1.20 mg/dL - <0.30 0.3  Alkaline Phos 40 - 150 U/L - 66 54  AST 5 - 34 U/L - 24 30  ALT 0 - 55 U/L - 31 31    Lab Results  Component Value Date   WBC 6.9 07/06/2017   HGB 11.6 11/13/2015   HCT 33.9 (L) 07/06/2017   MCV 96.2 07/06/2017   PLT 210 07/06/2017   NEUTROABS 4.2  07/06/2017    ASSESSMENT & PLAN:  Breast cancer of upper-outer quadrant of left female breast ([place]) Left Lumpectomy 12/19/15: ILC ,2.3 cm Grade 1, ALH, ER 80%, PR 0%, Ki 67: 5% Staging: T2Nx(Stage 2A) Patient refusedradiation therapy.  Current treatment: Adjuvant antiestrogen therapy with anastrozole 1 mg daily started 12/26/2015  Anastrozole toxicities: 1. Mild hot flashes 2. chronic musculoskeletal aches and pains but no different than prior to starting antiestrogen therapy.  Breast cancer surveillance:  1.  Breast exam 07/06/2017: Benign 2. mammograms at [facility] June 2018: Benign  Elevated serum immunoglobulin free light chains As part of workup for hypertension and chronic kidney disease, patient had blood work which included the following results. 06/24/2017: Kappa:166, lambda 7.55, ratio 21.99, creatinine 2.31, calcium 9.6, albumin 4.2 04/09/2017: Kappa 103, lambda 47.8, ratio 2.07 November 2016: A 78.9, lambda 42, ratio 1.76 May 2018: Pulse 72, lambda 42, ratio 1.72 May 2018: SPEP no M spike  I discussed with her  the difference between MGUS and myeloma.  I discussed the difference between light chains and heavy chains.  Workup plan: 1.  Repeat serum protein electrophoresis and kappa lambda ratio 2. CRP 3.  Beta-2 microglobulin 4.  CBC and CMP 5. Bone survey  Return to clinic in 1 week to decide if she needs a bone marrow biopsy.    I spent 25 minutes talking to the patient of which more than half was spent in counseling and coordination of care.  Orders Placed This Encounter  Procedures  . DG Bone Survey Met    Standing Status:   Future    Number of Occurrences:   1    Standing Expiration Date:   09/05/2018    Order Specific Question:   Reason for Exam (SYMPTOM  OR DIAGNOSIS REQUIRED)    Answer:   staging myeloma    Order Specific Question:   Preferred imaging location?    Answer:   [facility]  . Beta 2 microglobulin, serum    Standing Status:   Future     Number of Occurrences:   1    Standing Expiration Date:   08/10/2018  . Kappa/lambda light chains    Standing Status:   Future    Number of Occurrences:   1    Standing Expiration Date:   08/10/2018  . Multiple Myeloma Panel (SPEP&IFE w/QIG)    Standing Status:   Future    Number of Occurrences:   1    Standing Expiration Date:   08/10/2018  . CBC with Differential (Cancer Center Only)    Standing Status:   Future    Number of Occurrences:   1    Standing Expiration Date:   07/06/2018  . CMP ([facility] only)    Standing Status:   Future    Number of Occurrences:   1    Standing Expiration Date:   07/06/2018  . C-reactive protein    Standing Status:   Future    Number of Occurrences:   1    Standing Expiration Date:   07/06/2018   The patient has a good understanding of the overall plan. she agrees with it. she will call with any problems that may develop before the next visit here.   Harriette Ohara, MD 07/06/17

## 2017-07-06 NOTE — Assessment & Plan Note (Signed)
Left Lumpectomy 12/19/15: ILC ,2.3 cm Grade 1, ALH, ER 80%, PR 0%, Ki 67: 5% Staging: T2Nx(Stage 2A) Patient refusedradiation therapy.  Current treatment: Adjuvant antiestrogen therapy with anastrozole 1 mg daily started 12/26/2015  Anastrozole toxicities: 1. Mild hot flashes 2. chronic musculoskeletal aches and pains but no different than prior to starting antiestrogen therapy.  Breast cancer surveillance:  1.  Breast exam 07/06/2017: Benign 2. mammograms at [facility] June 2018: Benign  Left knee swelling with medial and lateral meniscus tears and tearing of the gastrocnemius tendon Patient will be seeing orthopedics  Return to clinic in 1 year for follow-up

## 2017-07-06 NOTE — Assessment & Plan Note (Signed)
As part of workup for hypertension and chronic kidney disease, patient had blood work which included the following results. 06/24/2017: Kappa:166, lambda 7.55, ratio 21.99, creatinine 2.31, calcium 9.6, albumin 4.2 04/09/2017: Kappa 103, lambda 47.8, ratio 2.07 November 2016: A 78.9, lambda 42, ratio 1.76 May 2018: Pulse 72, lambda 42, ratio 1.72 May 2018: SPEP no M spike  I discussed with her the difference between MGUS and myeloma.  I discussed the difference between light chains and heavy chains.  Workup plan: 1.  Repeat serum protein electrophoresis and kappa lambda ratio 2. CRP 3.  Beta-2 microglobulin 4.  CBC and CMP  Return to clinic in 1 week to decide if she needs a bone marrow biopsy.

## 2017-07-06 NOTE — Telephone Encounter (Signed)
Gave patient AVs and calendar of upcoming February appointments.  °

## 2017-07-07 LAB — KAPPA/LAMBDA LIGHT CHAINS
KAPPA, LAMDA LIGHT CHAIN RATIO: 2.12 — AB (ref 0.26–1.65)
Kappa free light chain: 110.8 mg/L — ABNORMAL HIGH (ref 3.3–19.4)
Lambda free light chains: 52.3 mg/L — ABNORMAL HIGH (ref 5.7–26.3)

## 2017-07-07 LAB — BETA 2 MICROGLOBULIN, SERUM: Beta-2 Microglobulin: 4 mg/L — ABNORMAL HIGH (ref 0.6–2.4)

## 2017-07-09 LAB — MULTIPLE MYELOMA PANEL, SERUM
ALBUMIN/GLOB SERPL: 1 (ref 0.7–1.7)
ALPHA2 GLOB SERPL ELPH-MCNC: 1.1 g/dL — AB (ref 0.4–1.0)
Albumin SerPl Elph-Mcnc: 3.6 g/dL (ref 2.9–4.4)
Alpha 1: 0.3 g/dL (ref 0.0–0.4)
B-GLOBULIN SERPL ELPH-MCNC: 1.4 g/dL — AB (ref 0.7–1.3)
Gamma Glob SerPl Elph-Mcnc: 1 g/dL (ref 0.4–1.8)
Globulin, Total: 3.8 g/dL (ref 2.2–3.9)
IGG (IMMUNOGLOBIN G), SERUM: 1055 mg/dL (ref 700–1600)
IgA: 509 mg/dL — ABNORMAL HIGH (ref 64–422)
IgM (Immunoglobulin M), Srm: 70 mg/dL (ref 26–217)
Total Protein ELP: 7.4 g/dL (ref 6.0–8.5)

## 2017-07-13 ENCOUNTER — Inpatient Hospital Stay (HOSPITAL_BASED_OUTPATIENT_CLINIC_OR_DEPARTMENT_OTHER): Payer: Medicare Other | Admitting: Hematology and Oncology

## 2017-07-13 DIAGNOSIS — C50412 Malignant neoplasm of upper-outer quadrant of left female breast: Secondary | ICD-10-CM

## 2017-07-13 DIAGNOSIS — Z170 Estrogen receptor positive status [ER+]: Secondary | ICD-10-CM | POA: Diagnosis not present

## 2017-07-13 DIAGNOSIS — R768 Other specified abnormal immunological findings in serum: Secondary | ICD-10-CM | POA: Diagnosis not present

## 2017-07-13 DIAGNOSIS — N951 Menopausal and female climacteric states: Secondary | ICD-10-CM

## 2017-07-13 DIAGNOSIS — Z79811 Long term (current) use of aromatase inhibitors: Secondary | ICD-10-CM

## 2017-07-13 NOTE — Assessment & Plan Note (Signed)
As part of workup for hypertension and chronic kidney disease, patient had blood work which included the following results. 06/24/2017: Kappa:166, lambda 7.55, ratio 21.99, creatinine 2.31, calcium 9.6, albumin 4.2 04/09/2017: Kappa 103, lambda 47.8, ratio 2.07 November 2016: A 78.9, lambda 42, ratio 1.76 May 2018: Kappa 72, lambda 42, ratio 1.72 May 2018: SPEP no M spike 07/06/17: Kappa 110.8, Lambda 52.3; Ratio: 2.12; No M-Spike  This represents polyclonal increase in light chains and does not constitute to either MGUS not MM.  I do not recommend any bone marrow biopsy or additional work up at this time.

## 2017-07-13 NOTE — Progress Notes (Signed)
Patient Care Team: Derinda Late, MD as PCP - General (Family Medicine) [name], MD as Consulting Physician (General Surgery) Nicholas Lose, MD as Consulting Physician (Hematology and Oncology) Gery Pray, MD as Consulting Physician (Radiation Oncology)  DIAGNOSIS:  Encounter Diagnoses  Name Primary?  . Elevated serum immunoglobulin free light chains   . Malignant neoplasm of upper-outer quadrant of left breast in female, estrogen receptor positive ([place])     SUMMARY OF ONCOLOGIC HISTORY:   Breast cancer of upper-outer quadrant of left female breast ([place])   11/04/2015 Initial Diagnosis    Left breast biopsy 2:00 position: Invasive lobular cancer grade 1, ER 80%, PR 0%, Ki-67 5%, HER-2 negative ratio 1.19, screening detected left breast mass 1.6 cm, T1 cN0 stage IA clinical stage      12/17/2015 Surgery    Left Lumpectomy [name]): ILC, 2.3 cm, Grade 1, ALH, ER 80%, PR 0%, Ki 67: 5%, HER-2 negative (1.19 ratio).      01/06/2016 -  Anti-estrogen oral therapy    Anastrozole 1 mg by mouth daily 5 years       CHIEF COMPLIANT: Follow-up to discuss the results of serum protein electrophoresis and myeloma workup  INTERVAL HISTORY: Rhonda [name] is a 84 year old with above-mentioned history of left breast cancer who was recently noted to have worsening serum creatinine accompanied by elevation of blood pressure.  Initial workup with serum light chains revealed an increase in the serum free light chains.  This prompted a consultation with [place].  We performed extensive blood work and she is here accompanied by her family to discuss the results.  There is no evidence of monoclonal protein.  Given the elevation of light chains is polyclonal in nature and does not support myeloma are monoclonal processes.    REVIEW OF SYSTEMS:   Constitutional: Generalized fatigue Eyes: Denies blurriness of vision Ears, nose, mouth, throat, and face: Denies mucositis or sore throat Respiratory:  Denies cough, dyspnea or wheezes Cardiovascular: Denies palpitation, chest discomfort Gastrointestinal:  Denies nausea, heartburn or change in bowel habits Skin: Denies abnormal skin rashes Lymphatics: Denies new lymphadenopathy or easy bruising Neurological:Denies numbness, tingling or new weaknesses Behavioral/Psych: Mood is stable, no new changes  Extremities: No lower extremity edema All other systems were reviewed with the patient and are negative.  I have reviewed the past medical history, past surgical history, social history and family history with the patient and they are unchanged from previous note.  ALLERGIES:  is allergic to codeine.  MEDICATIONS:  Current Outpatient Medications  Medication Sig Dispense Refill  . allopurinol (ZYLOPRIM) 100 MG tablet Take 1 tablet (100 mg total) by mouth daily.    Marland Kitchen anastrozole (ARIMIDEX) 1 MG tablet Take 1 tablet (1 mg total) by mouth daily. 90 tablet 3  . aspirin 81 MG tablet Take 81 mg by mouth daily.    . Calcium Carbonate-Vitamin D (CALTRATE 600+D PO) Take 2 tablets by mouth daily.    . Dulaglutide (TRULICITY) 1.5 EX/9.3ZJ SOPN Inject into the skin once a week.    . furosemide (LASIX) 40 MG tablet Take 40 mg by mouth.    . hydrALAZINE (APRESOLINE) 50 MG tablet Take 50 mg by mouth 2 (two) times daily.    Marland Kitchen levothyroxine (SYNTHROID, LEVOTHROID) 50 MCG tablet Take 50 mcg by mouth daily before breakfast.    . losartan (COZAAR) 50 MG tablet Take 50 mg by mouth daily.    . metoprolol succinate (TOPROL-XL) 50 MG 24 hr tablet Take 50 mg by mouth  daily. Take with or immediately following a meal.    . Multiple Vitamins-Minerals (MULTIVITAMIN & MINERAL PO) Take 1 tablet by mouth every morning.    . pravastatin (PRAVACHOL) 40 MG tablet Take 1 tablet (40 mg total) by mouth at bedtime. 30 tablet 0  . primidone (MYSOLINE) 250 MG tablet Take 0.5 tablets (125 mg total) by mouth 2 (two) times daily.    . ranitidine (ZANTAC) 150 MG tablet Take 1 tablet  (150 mg total) by mouth 2 (two) times daily.    Marland Kitchen rOPINIRole (REQUIP) 1 MG tablet Take 1.5 mg by mouth at bedtime.    . sitaGLIPtin (JANUVIA) 50 MG tablet Take 2 tablets (100 mg total) by mouth daily.     No current facility-administered medications for this visit.     PHYSICAL EXAMINATION: ECOG PERFORMANCE STATUS: 1 - Symptomatic but completely ambulatory  There were no vitals filed for this visit. There were no vitals filed for this visit.  GENERAL:alert, no distress and comfortable SKIN: skin color, texture, turgor are normal, no rashes or significant lesions EYES: normal, Conjunctiva are pink and non-injected, sclera clear OROPHARYNX:no exudate, no erythema and lips, buccal mucosa, and tongue normal  NECK: supple, thyroid normal size, non-tender, without nodularity LYMPH:  no palpable lymphadenopathy in the cervical, axillary or inguinal LUNGS: clear to auscultation and percussion with normal breathing effort HEART: regular rate & rhythm and no murmurs and no lower extremity edema ABDOMEN:abdomen soft, non-tender and normal bowel sounds MUSCULOSKELETAL:no cyanosis of digits and no clubbing  NEURO: alert & oriented x 3 with fluent speech, no focal motor/sensory deficits EXTREMITIES: No lower extremity edema  LABORATORY DATA:  I have reviewed the data as listed CMP Latest Ref Rng & Units 07/06/2017 12/16/2015 11/13/2015  Glucose 70 - 140 mg/dL 81 137(H) 121  BUN 7 - 26 mg/dL 33(H) 23(H) 22.0  Creatinine 0.60 - 1.10 mg/dL 2.32(H) 1.03(H) 1.2(H)  Sodium 136 - 145 mmol/L 139 134(L) 130(L)  Potassium 3.5 - 5.1 mmol/L 4.3 4.7 4.9  Chloride 98 - 109 mmol/L 99 98(L) -  CO2 22 - 29 mmol/L 28 28 23   Calcium 8.4 - 10.4 mg/dL 9.7 9.2 9.3  Total Protein 6.4 - 8.3 g/dL 7.9 - 7.9  Total Bilirubin 0.2 - 1.2 mg/dL 0.3 - <0.30  Alkaline Phos 40 - 150 U/L 105 - 66  AST 5 - 34 U/L 23 - 24  ALT 0 - 55 U/L 21 - 31    Lab Results  Component Value Date   WBC 6.9 07/06/2017   HGB 11.6  11/13/2015   HCT 33.9 (L) 07/06/2017   MCV 96.2 07/06/2017   PLT 210 07/06/2017   NEUTROABS 4.2 07/06/2017    ASSESSMENT & PLAN:  Elevated serum immunoglobulin free light chains As part of workup for hypertension and chronic kidney disease, patient had blood work which included the following results. 06/24/2017: Kappa:166, lambda 7.55, ratio 21.99, creatinine 2.31, calcium 9.6, albumin 4.2 04/09/2017: Kappa 103, lambda 47.8, ratio 2.07 November 2016: A 78.9, lambda 42, ratio 1.76 May 2018: Kappa 72, lambda 42, ratio 1.72 May 2018: SPEP no M spike 07/06/17: Kappa 110.8, Lambda 52.3; Ratio: 2.12; No M-Spike  This represents polyclonal increase in light chains and does not constitute to either MGUS not MM. I do not recommend any bone marrow biopsy or additional work up at this time. .  Breast cancer of upper-outer quadrant of left female breast ([place]) Left Lumpectomy 12/19/15: ILC ,2.3 cm Grade 1, ALH, ER 80%, PR 0%, Ki  67: 5% Staging: T2Nx(Stage 2A) Patient refusedradiation therapy.  Current treatment: Adjuvant antiestrogen therapy with anastrozole 1 mg daily started 12/26/2015  Anastrozole toxicities: 1. Mild hot flashes 2. chronic musculoskeletal aches and pains but no different than prior to starting antiestrogen therapy.  Breast cancer surveillance: 1.Breast exam 07/06/2017: Benign 2.mammograms at [facility] June 2018: Benign  Return to clinic in October at her regular scheduled appointment   I spent 25 minutes talking to the patient of which more than half was spent in counseling and coordination of care.  No orders of the defined types were placed in this encounter.  The patient has a good understanding of the overall plan. she agrees with it. she will call with any problems that may develop before the next visit here.   Harriette Ohara, MD 07/13/17

## 2017-07-13 NOTE — Assessment & Plan Note (Signed)
Left Lumpectomy 12/19/15: ILC ,2.3 cm Grade 1, ALH, ER 80%, PR 0%, Ki 67: 5% Staging: T2Nx(Stage 2A) Patient refusedradiation therapy.  Current treatment: Adjuvant antiestrogen therapy with anastrozole 1 mg daily started 12/26/2015  Anastrozole toxicities: 1. Mild hot flashes 2. chronic musculoskeletal aches and pains but no different than prior to starting antiestrogen therapy.  Breast cancer surveillance: 1.Breast exam2/04/2018: Benign [facility] 

## 2017-11-01 ENCOUNTER — Encounter (HOSPITAL_COMMUNITY): Payer: Medicare Other

## 2017-11-18 ENCOUNTER — Encounter (HOSPITAL_COMMUNITY): Payer: Medicare Other

## 2017-12-01 ENCOUNTER — Other Ambulatory Visit (HOSPITAL_COMMUNITY): Payer: Self-pay | Admitting: Family Medicine

## 2017-12-01 ENCOUNTER — Encounter: Payer: Self-pay | Admitting: Hematology and Oncology

## 2017-12-01 ENCOUNTER — Ambulatory Visit (HOSPITAL_COMMUNITY)
Admission: RE | Admit: 2017-12-01 | Discharge: 2017-12-01 | Disposition: A | Discharge status: Home / Self Care | Payer: Medicare Other | Source: Ambulatory Visit | Attending: Vascular Surgery | Admitting: Vascular Surgery

## 2017-12-01 DIAGNOSIS — I6523 Occlusion and stenosis of bilateral carotid arteries: Secondary | ICD-10-CM | POA: Insufficient documentation

## 2017-12-01 MED ORDER — HEPARIN SODIUM (PORCINE) 5000 UNIT/ML IJ SOLN
5000.00 | INTRAMUSCULAR | Status: DC
Start: 2017-11-30 — End: 2017-12-01

## 2017-12-01 MED ORDER — HYDRALAZINE HCL 25 MG PO TABS
50.00 | ORAL_TABLET | ORAL | Status: DC
Start: 2017-11-30 — End: 2017-12-01

## 2017-12-01 MED ORDER — ASPIRIN EC 81 MG PO TBEC
81.00 | DELAYED_RELEASE_TABLET | ORAL | Status: DC
Start: 2017-12-01 — End: 2017-12-01

## 2017-12-01 MED ORDER — FAMOTIDINE 20 MG PO TABS
20.00 | ORAL_TABLET | ORAL | Status: DC
Start: 2017-11-30 — End: 2017-12-01

## 2017-12-01 MED ORDER — PRIMIDONE 250 MG PO TABS
125.00 | ORAL_TABLET | ORAL | Status: DC
Start: 2017-11-30 — End: 2017-12-01

## 2017-12-01 MED ORDER — METOPROLOL SUCCINATE ER 50 MG PO TB24
50.00 | ORAL_TABLET | ORAL | Status: DC
Start: 2017-12-01 — End: 2017-12-01

## 2017-12-01 MED ORDER — ATORVASTATIN CALCIUM 10 MG PO TABS
10.00 | ORAL_TABLET | ORAL | Status: DC
Start: 2017-12-01 — End: 2017-12-01

## 2017-12-01 MED ORDER — LEVOTHYROXINE SODIUM 25 MCG PO TABS
50.00 | ORAL_TABLET | ORAL | Status: DC
Start: 2017-12-01 — End: 2017-12-01

## 2017-12-01 MED ORDER — ALLOPURINOL 100 MG PO TABS
100.00 | ORAL_TABLET | ORAL | Status: DC
Start: 2017-12-01 — End: 2017-12-01

## 2017-12-01 MED ORDER — LOSARTAN POTASSIUM 50 MG PO TABS
50.00 | ORAL_TABLET | ORAL | Status: DC
Start: 2017-12-01 — End: 2017-12-01

## 2017-12-01 MED ORDER — ANASTROZOLE 1 MG PO TABS
1.00 | ORAL_TABLET | ORAL | Status: DC
Start: 2017-12-01 — End: 2017-12-01

## 2017-12-01 MED ORDER — GENERIC EXTERNAL MEDICATION
Status: DC
Start: ? — End: 2017-12-01

## 2017-12-01 MED ORDER — DULAGLUTIDE 1.5 MG/0.5ML ~~LOC~~ SOPN
1.50 | PEN_INJECTOR | SUBCUTANEOUS | Status: DC
Start: 2017-12-05 — End: 2017-12-01

## 2017-12-01 MED ORDER — GENERIC EXTERNAL MEDICATION
500.00 | Status: DC
Start: 2017-12-01 — End: 2017-12-01

## 2017-12-01 MED ORDER — ROPINIROLE HCL 1 MG PO TABS
1.50 | ORAL_TABLET | ORAL | Status: DC
Start: 2017-11-30 — End: 2017-12-01

## 2018-03-23 ENCOUNTER — Telehealth: Payer: Self-pay | Admitting: Hematology and Oncology

## 2018-03-23 ENCOUNTER — Inpatient Hospital Stay: Payer: Medicare Other | Attending: Hematology and Oncology | Admitting: Hematology and Oncology

## 2018-03-23 DIAGNOSIS — Z79811 Long term (current) use of aromatase inhibitors: Secondary | ICD-10-CM | POA: Diagnosis not present

## 2018-03-23 DIAGNOSIS — N951 Menopausal and female climacteric states: Secondary | ICD-10-CM | POA: Diagnosis not present

## 2018-03-23 DIAGNOSIS — C50412 Malignant neoplasm of upper-outer quadrant of left female breast: Secondary | ICD-10-CM | POA: Diagnosis present

## 2018-03-23 DIAGNOSIS — Z7982 Long term (current) use of aspirin: Secondary | ICD-10-CM | POA: Diagnosis not present

## 2018-03-23 DIAGNOSIS — Z79899 Other long term (current) drug therapy: Secondary | ICD-10-CM | POA: Diagnosis not present

## 2018-03-23 DIAGNOSIS — Z170 Estrogen receptor positive status [ER+]: Secondary | ICD-10-CM | POA: Diagnosis not present

## 2018-03-23 DIAGNOSIS — R768 Other specified abnormal immunological findings in serum: Secondary | ICD-10-CM

## 2018-03-23 MED ORDER — FAMOTIDINE 20 MG PO TABS: 20.0000 mg | ORAL_TABLET | Freq: Two times a day (BID) | ORAL | Status: AC

## 2018-03-23 MED ORDER — ANASTROZOLE 1 MG PO TABS: 1.0000 mg | ORAL_TABLET | Freq: Every day | ORAL | 3 refills | Status: DC

## 2018-03-23 MED ORDER — FUROSEMIDE 20 MG PO TABS: 20.0000 mg | ORAL_TABLET | Freq: Every day | ORAL | Status: AC

## 2018-03-23 NOTE — Assessment & Plan Note (Signed)
As part of workup for hypertension and chronic kidney disease, patient had blood work which included the following results. 06/24/2017:Kappa:166, lambda 7.55, ratio 21.99, creatinine 2.31, calcium 9.6, albumin 4.2 04/09/2017:Kappa103, lambda 47.8, ratio 2.07 November 2016: A 78.9, lambda 42, ratio 1.76 May 2018: Kappa 72, lambda 42, ratio 1.72 May 2018: SPEP no M spike 07/06/17: Kappa 110.8, Lambda 52.3; Ratio: 2.12; No M-Spike  This represents polyclonal increase in light chains and does not constitute to either MGUS not MM.   There is no need for additional blood work for this

## 2018-03-23 NOTE — Telephone Encounter (Signed)
Printed avs and calendar for Oct 2020.

## 2018-03-23 NOTE — Progress Notes (Signed)
Patient Care Team: Derinda Late, MD as PCP - General (Family Medicine) [name], MD as Consulting Physician (General Surgery) Nicholas Lose, MD as Consulting Physician (Hematology and Oncology) Gery Pray, MD as Consulting Physician (Radiation Oncology)  DIAGNOSIS:  Encounter Diagnoses  Name Primary?  . Malignant neoplasm of upper-outer quadrant of left breast in female, estrogen receptor positive ([place])   . Elevated serum immunoglobulin free light chains     SUMMARY OF ONCOLOGIC HISTORY:   Breast cancer of upper-outer quadrant of left female breast ([name])   11/04/2015 Initial Diagnosis    Left breast biopsy 2:00 position: Invasive lobular cancer grade 1, ER 80%, PR 0%, Ki-67 5%, HER-2 negative ratio 1.19, screening detected left breast mass 1.6 cm, T1 cN0 stage IA clinical stage    12/17/2015 Surgery    Left Lumpectomy [name]): ILC, 2.3 cm, Grade 1, ALH, ER 80%, PR 0%, Ki 67: 5%, HER-2 negative (1.19 ratio).    01/06/2016 -  Anti-estrogen oral therapy    Anastrozole 1 mg by mouth daily 5 years     CHIEF COMPLIANT: Follow-up of history of left breast cancer on anastrozole therapy  INTERVAL HISTORY: Rhonda [name] is a 84 year old with above-mentioned history of left breast cancer treated with anastrozole and is doing quite well.  She denies any lumps or nodules in the breast.  She has had chronic kidney disease and hypertension which are being managed by her kidney specialist.  REVIEW OF SYSTEMS:   Constitutional: Denies fevers, chills or abnormal weight loss Eyes: Denies blurriness of vision Ears, nose, mouth, throat, and face: Denies mucositis or sore throat Respiratory: Denies cough, dyspnea or wheezes Cardiovascular: Denies palpitation, chest discomfort Gastrointestinal:  Denies nausea, heartburn or change in bowel habits Skin: Denies abnormal skin rashes Lymphatics: Denies new lymphadenopathy or easy bruising Neurological:Denies numbness, tingling or new  weaknesses Behavioral/Psych: Mood is stable, no new changes  Extremities: No lower extremity edema Breast:  denies any pain or lumps or nodules in either breasts All other systems were reviewed with the patient and are negative.  I have reviewed the past medical history, past surgical history, social history and family history with the patient and they are unchanged from previous note.  ALLERGIES:  is allergic to codeine.  MEDICATIONS:  Current Outpatient Medications  Medication Sig Dispense Refill  . allopurinol (ZYLOPRIM) 100 MG tablet Take 1 tablet (100 mg total) by mouth daily.    Marland Kitchen anastrozole (ARIMIDEX) 1 MG tablet Take 1 tablet (1 mg total) by mouth daily. 90 tablet 3  . aspirin 81 MG tablet Take 81 mg by mouth daily.    . Dulaglutide (TRULICITY) 1.5 UU/8.2CM SOPN Inject into the skin once a week.    . furosemide (LASIX) 40 MG tablet Take 40 mg by mouth.    . hydrALAZINE (APRESOLINE) 50 MG tablet Take 50 mg by mouth 2 (two) times daily.    Marland Kitchen levothyroxine (SYNTHROID, LEVOTHROID) 50 MCG tablet Take 50 mcg by mouth daily before breakfast.    . losartan (COZAAR) 50 MG tablet Take 50 mg by mouth daily.    . metoprolol succinate (TOPROL-XL) 50 MG 24 hr tablet Take 50 mg by mouth daily. Take with or immediately following a meal.    . Multiple Vitamins-Minerals (MULTIVITAMIN & MINERAL PO) Take 1 tablet by mouth every morning.    . pravastatin (PRAVACHOL) 40 MG tablet Take 1 tablet (40 mg total) by mouth at bedtime. 30 tablet 0  . primidone (MYSOLINE) 250 MG tablet Take 0.5 tablets (  125 mg total) by mouth 2 (two) times daily.    . ranitidine (ZANTAC) 150 MG tablet Take 1 tablet (150 mg total) by mouth 2 (two) times daily.    Marland Kitchen rOPINIRole (REQUIP) 1 MG tablet Take 1.5 mg by mouth at bedtime.    . sitaGLIPtin (JANUVIA) 50 MG tablet Take 2 tablets (100 mg total) by mouth daily.     No current facility-administered medications for this visit.     PHYSICAL EXAMINATION: ECOG PERFORMANCE  STATUS: 1 - Symptomatic but completely ambulatory  Vitals:   03/23/18 1130  BP: (!) 149/56  Pulse: 70  Resp: 16  Temp: 97.7 F (36.5 C)  SpO2: 97%   Filed Weights   03/23/18 1130  Weight: 155 lb 11.2 oz (70.6 kg)    GENERAL:alert, no distress and comfortable SKIN: skin color, texture, turgor are normal, no rashes or significant lesions EYES: normal, Conjunctiva are pink and non-injected, sclera clear OROPHARYNX:no exudate, no erythema and lips, buccal mucosa, and tongue normal  NECK: supple, thyroid normal size, non-tender, without nodularity LYMPH:  no palpable lymphadenopathy in the cervical, axillary or inguinal LUNGS: clear to auscultation and percussion with normal breathing effort HEART: regular rate & rhythm and no murmurs and no lower extremity edema ABDOMEN:abdomen soft, non-tender and normal bowel sounds MUSCULOSKELETAL:no cyanosis of digits and no clubbing  NEURO: alert & oriented x 3 with fluent speech, no focal motor/sensory deficits EXTREMITIES: No lower extremity edema BREAST: No palpable masses or nodules in either right or left breasts. No palpable axillary supraclavicular or infraclavicular adenopathy no breast tenderness or nipple discharge. (exam performed in the presence of a chaperone)  LABORATORY DATA:  I have reviewed the data as listed CMP Latest Ref Rng & Units 07/06/2017 12/16/2015 11/13/2015  Glucose 70 - 140 mg/dL 81 137(H) 121  BUN 7 - 26 mg/dL 33(H) 23(H) 22.0  Creatinine 0.60 - 1.10 mg/dL 2.32(H) 1.03(H) 1.2(H)  Sodium 136 - 145 mmol/L 139 134(L) 130(L)  Potassium 3.5 - 5.1 mmol/L 4.3 4.7 4.9  Chloride 98 - 109 mmol/L 99 98(L) -  CO2 22 - 29 mmol/L 28 28 23   Calcium 8.4 - 10.4 mg/dL 9.7 9.2 9.3  Total Protein 6.4 - 8.3 g/dL 7.9 - 7.9  Total Bilirubin 0.2 - 1.2 mg/dL 0.3 - <0.30  Alkaline Phos 40 - 150 U/L 105 - 66  AST 5 - 34 U/L 23 - 24  ALT 0 - 55 U/L 21 - 31    Lab Results  Component Value Date   WBC 6.9 07/06/2017   HGB 11.3 (L)  07/06/2017   HCT 33.9 (L) 07/06/2017   MCV 96.2 07/06/2017   PLT 210 07/06/2017   NEUTROABS 4.2 07/06/2017    ASSESSMENT & PLAN:  Breast cancer of upper-outer quadrant of left female breast ([name]) Left Lumpectomy 12/19/15: ILC ,2.3 cm Grade 1, ALH, ER 80%, PR 0%, Ki 67: 5% Staging: T2Nx(Stage 2A) Patient refusedradiation therapy.  Current treatment: Adjuvant antiestrogen therapy with anastrozole 1 mg daily started 12/26/2015  Anastrozole toxicities: 1. Mild hot flashes 2. chronic musculoskeletal aches and pains but no different than prior to starting antiestrogen therapy.  Breast cancer surveillance: 1.Breast exam 03/23/2018: Benign 2.mammograms at [facility] 2019: Benign  Elevated serum immunoglobulin free light chains As part of workup for hypertension and chronic kidney disease, patient had blood work which included the following results. 06/24/2017:Kappa:166, lambda 7.55, ratio 21.99, creatinine 2.31, calcium 9.6, albumin 4.2 04/09/2017:Kappa103, lambda 47.8, ratio 2.07 November 2016: A 78.9, lambda 42, ratio 1.76  May 2018: Kappa 72, lambda 42, ratio 1.72 May 2018: SPEP no M spike 07/06/17: Kappa 110.8, Lambda 52.3; Ratio: 2.12; No M-Spike  This represents polyclonal increase in light chains and does not constitute to either MGUS not MM.   There is no need for additional blood work for this I sent another prescription for anastrozole for 1 year Return to clinic in 1 year for follow-up  No orders of the defined types were placed in this encounter.  The patient has a good understanding of the overall plan. she agrees with it. she will call with any problems that may develop before the next visit here.   Harriette Ohara, MD 03/23/18

## 2018-03-23 NOTE — Assessment & Plan Note (Signed)
Left Lumpectomy 12/19/15: ILC ,2.3 cm Grade 1, ALH, ER 80%, PR 0%, Ki 67: 5% Staging: T2Nx(Stage 2A) Patient refusedradiation therapy.  Current treatment: Adjuvant antiestrogen therapy with anastrozole 1 mg daily started 12/26/2015  Anastrozole toxicities: 1. Mild hot flashes 2. chronic musculoskeletal aches and pains but no different than prior to starting antiestrogen therapy.  Breast cancer surveillance: 1.Breast exam2/04/2018: Benign 2.mammograms at [place]: Benign

## 2018-05-31 ENCOUNTER — Encounter: Payer: Medicare Other | Attending: Family Medicine | Admitting: *Deleted

## 2018-05-31 DIAGNOSIS — E119 Type 2 diabetes mellitus without complications: Secondary | ICD-10-CM | POA: Diagnosis present

## 2018-05-31 NOTE — Progress Notes (Signed)
Diabetes Self-Management Education  Visit Type: First/Initial  Appt. Start Time: 1410 Appt. End Time: 0814  05/31/2018  Rhonda [name], identified by name and date of birth, is a 85 y.o. female with a diagnosis of Diabetes: Type 2. She is here with her husband, Lynnae Sandhoff, who participated in the visit in a positive way. She has attended diabetes classes in the past and expresses good verbal understanding of healthy food choices and general diabetes care. Her current A1c is 6.3%. She is here to learn how to give insulin to attempt to control BG as well as possible to delay further kidney damage.  ASSESSMENT  There were no vitals taken for this visit. There is no height or weight on file to calculate BMI.  Diabetes Self-Management Education - 05/31/18 1407      Visit Information   Visit Type  First/Initial      Initial Visit   Diabetes Type  Type 2    Are you currently following a meal plan?  No    Are you taking your medications as prescribed?  Yes      Health Coping   How would you rate your overall health?  Good      Psychosocial Assessment   Patient Belief/Attitude about Diabetes  Motivated to manage diabetes    Self-care barriers  Unsteady gait/risk for falls    Self-management support  Family    Other persons present  Patient;Spouse/SO    Patient Concerns  Medication;Problem Solving    Special Needs  None    Preferred Learning Style  No preference indicated    Learning Readiness  Ready    How often do you need to have someone help you when you read instructions, pamphlets, or other written materials from your doctor or pharmacy?  1 - Never    What is the last grade level you completed in school?  1 year college      Pre-Education Assessment   Patient understands the diabetes disease and treatment process.  Needs Review    Patient understands incorporating nutritional management into lifestyle.  Demonstrates understanding / competency    Patient undertands incorporating  physical activity into lifestyle.  Needs Review    Patient understands using medications safely.  Needs Instruction   new to insulin   Patient understands monitoring blood glucose, interpreting and using results  Demonstrates understanding / competency    Patient understands prevention, detection, and treatment of acute complications.  Demonstrates understanding / competency    Patient understands prevention, detection, and treatment of chronic complications.  Demonstrates understanding / competency    Patient understands how to develop strategies to address psychosocial issues.  Demonstrates understanding / competency    Patient understands how to develop strategies to promote health/change behavior.  Demonstrates understanding / competency      Complications   Last HgB A1C per patient/outside source  6.3 %    How often do you check your blood sugar?  1-2 times/day    Fasting Blood glucose range (mg/dL)  70-129    Number of hypoglycemic episodes per month  0    Have you had a dilated eye exam in the past 12 months?  Yes    Have you had a dental exam in the past 12 months?  Yes    Are you checking your feet?  Yes    How many days per week are you checking your feet?  2      Exercise   Exercise Type  ADL's  Patient Education   Previous Diabetes Education  Yes (please comment)    Disease state   Explored patient's options for treatment of their diabetes    Nutrition management   Meal timing in regards to the patients' current diabetes medication.    Physical activity and exercise   Helped patient identify appropriate exercises in relation to his/her diabetes, diabetes complications and other health issue.    Medications  Other (comment)   taught how to give insulin with pen   Monitoring  Identified appropriate SMBG and/or A1C goals.    Acute complications  Taught treatment of hypoglycemia - the 15 rule.    Psychosocial adjustment  Role of stress on diabetes      Outcomes    Expected Outcomes  Demonstrated interest in learning. Expect positive outcomes    Future DMSE  PRN    Program Status  Completed       Individualized Plan for Diabetes Self-Management Training:   Learning Objective:  Patient will have a greater understanding of diabetes self-management. Patient education plan is to attend individual and/or group sessions per assessed needs and concerns.   Plan:   Patient Instructions  Insulin Instruction  The following learning objectives were met by the patient during this visit:   Insulin Action of Lantus or similar long acting insulins  Reviewed use of pen including # units per  Pen cartridge and needle   lengths  Hygiene and storage  Dialing dose with pen  Rotation of Sites  Hypoglycemia- symptoms, causes , treatment choices  Record keeping and MD follow up   Patient demonstrated understanding of insulin administration by return demonstration.  Expected Outcomes:  Demonstrated interest in learning. Expect positive outcomes  Education material provided: A1C conversion sheet, Insulin Instruction handout, Insulin Action handout  If problems or questions, patient to contact team via:  Phone  Future DSME appointment: PRN

## 2018-05-31 NOTE — Patient Instructions (Addendum)
Insulin Instruction  The following learning objectives were met by the patient during this visit:   Insulin Action of Lantus or similar long acting insulins  Reviewed use of pen including # units per  Pen cartridge and needle lengths  Hygiene and storage  Dialing dose with pen   Rotation of Sites  Hypoglycemia- symptoms, causes , treatment choices  Record keeping and MD follow up   Patient demonstrated understanding of insulin administration by return demonstration.

## 2019-03-23 NOTE — Progress Notes (Signed)
Patient Care Team: Derinda Late, MD as PCP - General (Family Medicine) [name], MD as Consulting Physician (General Surgery) Nicholas Lose, MD as Consulting Physician (Hematology and Oncology) Gery Pray, MD as Consulting Physician (Radiation Oncology)  DIAGNOSIS:    ICD-10-CM   1. Malignant neoplasm of upper-outer quadrant of left breast in female, estrogen receptor positive ([place])  C50.412    Z17.0   2. Elevated serum immunoglobulin free light chains  R76.8     SUMMARY OF ONCOLOGIC HISTORY: Oncology History  Breast cancer of upper-outer quadrant of left female breast ([place])  11/04/2015 Initial Diagnosis   Left breast biopsy 2:00 position: Invasive lobular cancer grade 1, ER 80%, PR 0%, Ki-67 5%, HER-2 negative ratio 1.19, screening detected left breast mass 1.6 cm, T1 cN0 stage IA clinical stage   12/17/2015 Surgery   Left Lumpectomy [name]): ILC, 2.3 cm, Grade 1, ALH, ER 80%, PR 0%, Ki 67: 5%, HER-2 negative (1.19 ratio).   01/06/2016 -  Anti-estrogen oral therapy   Anastrozole 1 mg by mouth daily 5 years     CHIEF COMPLIANT: Follow-up of history of left breast cancer on anastrozole therapy  INTERVAL HISTORY: Rhonda [name] is a 85 y.o. with above-mentioned history of left breast cancer treated with a lumpectomy and who is currently on anastrozole. I last saw her a year ago. She presents to the clinic today for annual follow-up.    REVIEW OF SYSTEMS:   Constitutional: Denies fevers, chills or abnormal weight loss Eyes: Denies blurriness of vision Ears, nose, mouth, throat, and face: Denies mucositis or sore throat Respiratory: Denies cough, dyspnea or wheezes Cardiovascular: Denies palpitation, chest discomfort Gastrointestinal: Denies nausea, heartburn or change in bowel habits Skin: Denies abnormal skin rashes Lymphatics: Denies new lymphadenopathy or easy bruising Neurological: Denies numbness, tingling or new weaknesses Behavioral/Psych: Mood is stable,  no new changes  Extremities: No lower extremity edema Breast: denies any pain or lumps or nodules in either breasts All other systems were reviewed with the patient and are negative.  I have reviewed the past medical history, past surgical history, social history and family history with the patient and they are unchanged from previous note.  ALLERGIES:  is allergic to codeine.  MEDICATIONS:  Current Outpatient Medications  Medication Sig Dispense Refill  . allopurinol (ZYLOPRIM) 100 MG tablet Take 1 tablet (100 mg total) by mouth daily.    Marland Kitchen anastrozole (ARIMIDEX) 1 MG tablet Take 1 tablet (1 mg total) by mouth daily. 90 tablet 3  . aspirin 81 MG tablet Take 81 mg by mouth daily.    . Dulaglutide (TRULICITY) 1.5 MH/9.6QI SOPN Inject into the skin once a week.    . famotidine (PEPCID) 20 MG tablet Take 1 tablet (20 mg total) by mouth 2 (two) times daily.    . furosemide (LASIX) 20 MG tablet Take 1 tablet (20 mg total) by mouth daily.    . hydrALAZINE (APRESOLINE) 50 MG tablet Take 50 mg by mouth 2 (two) times daily.    Marland Kitchen levothyroxine (SYNTHROID, LEVOTHROID) 50 MCG tablet Take 50 mcg by mouth daily before breakfast.    . losartan (COZAAR) 50 MG tablet Take 50 mg by mouth daily.    . metoprolol succinate (TOPROL-XL) 50 MG 24 hr tablet Take 50 mg by mouth daily. Take with or immediately following a meal.    . Multiple Vitamins-Minerals (MULTIVITAMIN & MINERAL PO) Take 1 tablet by mouth every morning.    . pravastatin (PRAVACHOL) 40 MG tablet Take 1 tablet (  40 mg total) by mouth at bedtime. 30 tablet 0  . primidone (MYSOLINE) 250 MG tablet Take 0.5 tablets (125 mg total) by mouth 2 (two) times daily.    . ranitidine (ZANTAC) 150 MG tablet Take 1 tablet (150 mg total) by mouth 2 (two) times daily.    Marland Kitchen rOPINIRole (REQUIP) 1 MG tablet Take 1.5 mg by mouth at bedtime.    . sitaGLIPtin (JANUVIA) 50 MG tablet Take 2 tablets (100 mg total) by mouth daily.     No current facility-administered  medications for this visit.     PHYSICAL EXAMINATION: ECOG PERFORMANCE STATUS: 1 - Symptomatic but completely ambulatory  There were no vitals filed for this visit. There were no vitals filed for this visit.  GENERAL: alert, no distress and comfortable SKIN: skin color, texture, turgor are normal, no rashes or significant lesions EYES: normal, Conjunctiva are pink and non-injected, sclera clear OROPHARYNX: no exudate, no erythema and lips, buccal mucosa, and tongue normal  NECK: supple, thyroid normal size, non-tender, without nodularity LYMPH: no palpable lymphadenopathy in the cervical, axillary or inguinal LUNGS: clear to auscultation and percussion with normal breathing effort HEART: regular rate & rhythm and no murmurs and no lower extremity edema ABDOMEN: abdomen soft, non-tender and normal bowel sounds MUSCULOSKELETAL: no cyanosis of digits and no clubbing  NEURO: alert & oriented x 3 with fluent speech, no focal motor/sensory deficits EXTREMITIES: No lower extremity edema BREAST: No palpable masses or nodules in either right or left breasts. No palpable axillary supraclavicular or infraclavicular adenopathy no breast tenderness or nipple discharge. (exam performed in the presence of a chaperone)  LABORATORY DATA:  I have reviewed the data as listed CMP Latest Ref Rng & Units 07/06/2017 12/16/2015 11/13/2015  Glucose 70 - 140 mg/dL 81 137(H) 121  BUN 7 - 26 mg/dL 33(H) 23(H) 22.0  Creatinine 0.60 - 1.10 mg/dL 2.32(H) 1.03(H) 1.2(H)  Sodium 136 - 145 mmol/L 139 134(L) 130(L)  Potassium 3.5 - 5.1 mmol/L 4.3 4.7 4.9  Chloride 98 - 109 mmol/L 99 98(L) -  CO2 22 - 29 mmol/L 28 28 23   Calcium 8.4 - 10.4 mg/dL 9.7 9.2 9.3  Total Protein 6.4 - 8.3 g/dL 7.9 - 7.9  Total Bilirubin 0.2 - 1.2 mg/dL 0.3 - <0.30  Alkaline Phos 40 - 150 U/L 105 - 66  AST 5 - 34 U/L 23 - 24  ALT 0 - 55 U/L 21 - 31    Lab Results  Component Value Date   WBC 6.9 07/06/2017   HGB 11.3 (L) 07/06/2017    HCT 33.9 (L) 07/06/2017   MCV 96.2 07/06/2017   PLT 210 07/06/2017   NEUTROABS 4.2 07/06/2017    ASSESSMENT & PLAN:  Breast cancer of upper-outer quadrant of left female breast ([place]) Left Lumpectomy 12/19/15: ILC ,2.3 cm Grade 1, ALH, ER 80%, PR 0%, Ki 67: 5% Staging: T2Nx(Stage 2A) Patient refusedradiation therapy.  Current treatment: Adjuvant antiestrogen therapy with anastrozole 1 mg daily started 12/26/2015  Anastrozole toxicities: 1. Mild hot flashes 2. chronic musculoskeletal aches and pains but no different than prior to starting antiestrogen therapy.  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 06/24/2017:Kappa:166, lambda 7.55, ratio  21.99, creatinine 2.31, calcium 9.6, albumin 4.2 04/09/2017:Kappa103, lambda 47.8, ratio 2.07 November 2016: A 78.9, lambda 42, ratio 1.76 May 2018:Kappa72, lambda 42, ratio 1.72 May 2018: SPEP no M spike 07/06/17: Kappa 110.8, Lambda 52.3; Ratio: 2.12; No M-Spike  This represents polyclonal increase in light chains and is not MGUS    There is no need for additional blood work for this     No orders of the defined types were placed in this encounter.  The patient has a good understanding of the overall plan. she agrees with it. she will call with any problems that may develop before the next visit here.  Nicholas Lose, MD 03/24/2019  Julious Oka Dorshimer am acting as scribe for Dr. Nicholas Lose.  I have reviewed the above documentation for accuracy and completeness, and I agree with the above.

## 2019-03-24 ENCOUNTER — Telehealth: Payer: Self-pay | Admitting: Hematology and Oncology

## 2019-03-24 ENCOUNTER — Other Ambulatory Visit: Payer: Self-pay

## 2019-03-24 ENCOUNTER — Inpatient Hospital Stay: Payer: Medicare Other | Attending: Hematology and Oncology | Admitting: Hematology and Oncology

## 2019-03-24 DIAGNOSIS — C50412 Malignant neoplasm of upper-outer quadrant of left female breast: Secondary | ICD-10-CM | POA: Insufficient documentation

## 2019-03-24 DIAGNOSIS — Z79811 Long term (current) use of aromatase inhibitors: Secondary | ICD-10-CM | POA: Diagnosis not present

## 2019-03-24 DIAGNOSIS — Z170 Estrogen receptor positive status [ER+]: Secondary | ICD-10-CM | POA: Insufficient documentation

## 2019-03-24 DIAGNOSIS — N951 Menopausal and female climacteric states: Secondary | ICD-10-CM | POA: Insufficient documentation

## 2019-03-24 DIAGNOSIS — R768 Other specified abnormal immunological findings in serum: Secondary | ICD-10-CM

## 2019-03-24 MED ORDER — ANASTROZOLE 1 MG PO TABS: 1.0000 mg | ORAL_TABLET | Freq: Every day | ORAL | 3 refills | Status: DC

## 2019-03-24 NOTE — Assessment & Plan Note (Signed)
Left Lumpectomy 12/19/15: ILC ,2.3 cm Grade 1, ALH, ER 80%, PR 0%, Ki 67: 5% Staging: T2Nx(Stage 2A) Patient refusedradiation therapy.  Current treatment: Adjuvant antiestrogen therapy with anastrozole 1 mg daily started 12/26/2015  Anastrozole toxicities: 1. Mild hot flashes 2. chronic musculoskeletal aches and pains but no different than prior to starting antiestrogen therapy.  Breast cancer surveillance: 1.Breast exam 03/24/2019: Benign 2.mammograms at [facility]7/02/2018: Benign  I sent another prescription for anastrozole for 1 year Return to clinic in 1 year for follow-up

## 2019-03-24 NOTE — Assessment & Plan Note (Signed)
Elevated serum immunoglobulin free light chains As part of workup for hypertension and chronic kidney disease, patient had blood work which included the following results. 06/24/2017:Kappa:166, lambda 7.55, ratio 21.99, creatinine 2.31, calcium 9.6, albumin 4.2 04/09/2017:Kappa103, lambda 47.8, ratio 2.07 November 2016: A 78.9, lambda 42, ratio 1.76 May 2018:Kappa72, lambda 42, ratio 1.72 May 2018: SPEP no M spike 07/06/17: Kappa 110.8, Lambda 52.3; Ratio: 2.12; No M-Spike  This represents polyclonal increase in light chains and is not MGUS    There is no need for additional blood work for this

## 2019-03-24 NOTE — Telephone Encounter (Signed)
I could not reach patient regarding schedule  °

## 2020-03-25 ENCOUNTER — Inpatient Hospital Stay: Payer: Medicare Other | Attending: Hematology and Oncology | Admitting: Hematology and Oncology

## 2020-03-25 NOTE — Assessment & Plan Note (Deleted)
Left Lumpectomy 12/19/15: ILC ,2.3 cm Grade 1, ALH, ER 80%, PR 0%, Ki 67: 5% Staging: T2Nx(Stage 2A) Patient refusedradiation therapy.  Current treatment: Adjuvant antiestrogen therapy with anastrozole 1 mg daily started 12/26/2015  Anastrozole toxicities: 1. Mild hot flashes 2. chronic musculoskeletal aches and pains but no different than prior to starting antiestrogen therapy.  Breast cancer surveillance: 1.Breast exam11/05/2019: Benign 2.mammograms at [facility]7/15/2021: Benign, breast density category C  I sent another prescription for anastrozole for 1 year Return to clinic in 1 year for follow-up  Elevated serum immunoglobulin free light chains As part of workup for hypertension and chronic kidney disease, patient had blood work which included the following results. 06/24/2017:Kappa:166, lambda 7.55, ratio 21.99, creatinine 2.31, calcium 9.6, albumin 4.2 04/09/2017:Kappa103, lambda 47.8, ratio 2.07 November 2016: A 78.9, lambda 42, ratio 1.76 May 2018:Kappa72, lambda 42, ratio 1.72 May 2018: SPEP no M spike 07/06/17: Kappa 110.8, Lambda 52.3; Ratio: 2.12; No M-Spike  This represents polyclonal increase in light chains and is not MGUS  We stopped following the polyclonal increase in the immunoglobulins.

## 2020-05-06 ENCOUNTER — Other Ambulatory Visit: Payer: Self-pay | Admitting: *Deleted

## 2020-05-06 ENCOUNTER — Telehealth: Payer: Self-pay | Admitting: Hematology and Oncology

## 2020-05-06 DIAGNOSIS — C50412 Malignant neoplasm of upper-outer quadrant of left female breast: Secondary | ICD-10-CM

## 2020-05-06 MED ORDER — ANASTROZOLE 1 MG PO TABS: 1.0000 mg | ORAL_TABLET | Freq: Every day | ORAL | 0 refills | Status: DC

## 2020-05-06 NOTE — Telephone Encounter (Signed)
Scheduled appt per 12/13 sch msg - pt is aware of appt date and time   

## 2020-05-09 ENCOUNTER — Other Ambulatory Visit: Payer: Self-pay | Admitting: Hematology and Oncology

## 2020-05-09 DIAGNOSIS — C50412 Malignant neoplasm of upper-outer quadrant of left female breast: Secondary | ICD-10-CM

## 2020-06-03 ENCOUNTER — Ambulatory Visit: Payer: Medicare Other | Admitting: Hematology and Oncology

## 2020-08-06 ENCOUNTER — Telehealth: Payer: Self-pay | Admitting: Hematology and Oncology

## 2020-08-06 NOTE — Telephone Encounter (Signed)
Rescheduled 01/10 cancelled appointment to 03/21 per patient's request.

## 2020-08-11 NOTE — Progress Notes (Signed)
Patient Care Team: Derinda Late, MD as PCP - General (Family Medicine) [name], MD as Consulting Physician (General Surgery) Nicholas Lose, MD as Consulting Physician (Hematology and Oncology) Gery Pray, MD as Consulting Physician (Radiation Oncology)  DIAGNOSIS:    ICD-10-CM   1. Malignant neoplasm of upper-outer quadrant of left breast in female, estrogen receptor positive ([place])  C50.412    Z17.0     SUMMARY OF ONCOLOGIC HISTORY: Oncology History  Breast cancer of upper-outer quadrant of left female breast ([place])  11/04/2015 Initial Diagnosis   Left breast biopsy 2:00 position: Invasive lobular cancer grade 1, ER 80%, PR 0%, Ki-67 5%, HER-2 negative ratio 1.19, screening detected left breast mass 1.6 cm, T1 cN0 stage IA clinical stage   12/17/2015 Surgery   Left Lumpectomy [name]): ILC, 2.3 cm, Grade 1, ALH, ER 80%, PR 0%, Ki 67: 5%, HER-2 negative (1.19 ratio).   01/06/2016 -  Anti-estrogen oral therapy   Anastrozole 1 mg by mouth daily 5 years     CHIEF COMPLIANT: Follow-up of history of left breast cancer on anastrozole therapy  INTERVAL HISTORY: Rhonda [name] is a 87 y.o. with above-mentioned history of left breast cancer treated with a lumpectomy and who is currently on anastrozole. Mammogram on 12/07/19 showed no evidence of malignancy bilaterally. She presents to the clinic today for annual follow-up.    She has tolerated anastrozole fairly well.  She denies any lumps or nodules in the breast.  ALLERGIES:  is allergic to codeine.  MEDICATIONS:  Current Outpatient Medications  Medication Sig Dispense Refill  . allopurinol (ZYLOPRIM) 100 MG tablet Take 1 tablet (100 mg total) by mouth daily.    Marland Kitchen anastrozole (ARIMIDEX) 1 MG tablet Take 1 tablet (1 mg total) by mouth daily. 90 tablet 0  . aspirin 81 MG tablet Take 81 mg by mouth daily.    . Dulaglutide (TRULICITY) 1.5 GH/8.2XH SOPN Inject into the skin once a week.    . famotidine (PEPCID) 20 MG tablet  Take 1 tablet (20 mg total) by mouth 2 (two) times daily.    . furosemide (LASIX) 20 MG tablet Take 1 tablet (20 mg total) by mouth daily.    . hydrALAZINE (APRESOLINE) 50 MG tablet Take 50 mg by mouth 2 (two) times daily.    Marland Kitchen levothyroxine (SYNTHROID, LEVOTHROID) 50 MCG tablet Take 50 mcg by mouth daily before breakfast.    . losartan (COZAAR) 50 MG tablet Take 50 mg by mouth daily.    . metoprolol succinate (TOPROL-XL) 50 MG 24 hr tablet Take 50 mg by mouth daily. Take with or immediately following a meal.    . Multiple Vitamins-Minerals (MULTIVITAMIN & MINERAL PO) Take 1 tablet by mouth every morning.    . pravastatin (PRAVACHOL) 40 MG tablet Take 1 tablet (40 mg total) by mouth at bedtime. 30 tablet 0  . primidone (MYSOLINE) 250 MG tablet Take 0.5 tablets (125 mg total) by mouth 2 (two) times daily.    Marland Kitchen rOPINIRole (REQUIP) 1 MG tablet Take 1.5 mg by mouth at bedtime.    . sitaGLIPtin (JANUVIA) 50 MG tablet Take 2 tablets (100 mg total) by mouth daily.     No current facility-administered medications for this visit.    PHYSICAL EXAMINATION: ECOG PERFORMANCE STATUS: 1 - Symptomatic but completely ambulatory  Vitals:   08/12/20 0942  BP: (!) 147/60  Pulse: 73  Resp: 18  Temp: (!) 97.5 F (36.4 C)  SpO2: 97%   Filed Weights   08/12/20 0942  Weight: 158 lb 12.8 oz (72 kg)    BREAST: No palpable masses or nodules in either right or left breasts. No palpable axillary supraclavicular or infraclavicular adenopathy no breast tenderness or nipple discharge. (exam performed in the presence of a chaperone)  LABORATORY DATA:  I have reviewed the data as listed CMP Latest Ref Rng & Units 07/06/2017 12/16/2015 11/13/2015  Glucose 70 - 140 mg/dL 81 137(H) 121  BUN 7 - 26 mg/dL 33(H) 23(H) 22.0  Creatinine 0.60 - 1.10 mg/dL 2.32(H) 1.03(H) 1.2(H)  Sodium 136 - 145 mmol/L 139 134(L) 130(L)  Potassium 3.5 - 5.1 mmol/L 4.3 4.7 4.9  Chloride 98 - 109 mmol/L 99 98(L) -  CO2 22 - 29 mmol/L _0 Calcium 8.4 - 10.4 mg/dL 9.7 9.2 9.3  Total Protein 6.4 - 8.3 g/dL 7.9 - 7.9  Total Bilirubin 0.2 - 1.2 mg/dL 0.3 - <0.30  Alkaline Phos 40 - 150 U/L 105 - 66  AST 5 - 34 U/L 23 - 24  ALT 0 - 55 U/L 21 - 31    Lab Results  Component Value Date   WBC 6.9 07/06/2017   HGB 11.3 (L) 07/06/2017   HCT 33.9 (L) 07/06/2017   MCV 96.2 07/06/2017   PLT 210 07/06/2017   NEUTROABS 4.2 07/06/2017    ASSESSMENT & PLAN:  Breast cancer of upper-outer quadrant of left female breast ([place]) Left Lumpectomy 12/19/15: ILC ,2.3 cm Grade 1, ALH, ER 80%, PR 0%, Ki 67: 5% Staging: T2Nx(Stage 2A) Patient refusedradiation therapy.  Current treatment: Adjuvant antiestrogen therapy with anastrozole 1 mg daily started 12/26/2015  Anastrozole toxicities: 1. Mild hot flashes 2. chronic musculoskeletal aches and pains but no different than prior to starting antiestrogen therapy.  Breast cancer surveillance: 1.Breast exam3/21/2022: Benign 2.mammograms at [facility]7/15/2021: Benign   Since she completed 5 years of therapy we decided to discontinue anastrozole at this time. Return to clinic on an as-needed basis.  No orders of the defined types were placed in this encounter.  The patient has a good understanding of the overall plan. she agrees with it. she will call with any problems that may develop before the next visit here.  Total time spent: 30 mins including face to face time and time spent for planning, charting and coordination of care  Rulon Eisenmenger, MD, MPH 08/12/2020  I, Molly Dorshimer, am acting as scribe for Dr. Nicholas Lose.  I have reviewed the above documentation for accuracy and completeness, and I agree with the above.

## 2020-08-12 ENCOUNTER — Inpatient Hospital Stay: Payer: Medicare Other | Attending: Hematology and Oncology | Admitting: Hematology and Oncology

## 2020-08-12 ENCOUNTER — Other Ambulatory Visit: Payer: Self-pay

## 2020-08-12 DIAGNOSIS — Z79899 Other long term (current) drug therapy: Secondary | ICD-10-CM | POA: Diagnosis not present

## 2020-08-12 DIAGNOSIS — C50412 Malignant neoplasm of upper-outer quadrant of left female breast: Secondary | ICD-10-CM

## 2020-08-12 DIAGNOSIS — Z170 Estrogen receptor positive status [ER+]: Secondary | ICD-10-CM | POA: Diagnosis not present

## 2020-08-12 DIAGNOSIS — Z853 Personal history of malignant neoplasm of breast: Secondary | ICD-10-CM | POA: Diagnosis present

## 2020-08-12 MED ORDER — HYDRALAZINE HCL 50 MG PO TABS: 50.0000 mg | ORAL_TABLET | Freq: Three times a day (TID) | ORAL | Status: AC

## 2020-08-12 NOTE — Assessment & Plan Note (Signed)
Left Lumpectomy 12/19/15: ILC ,2.3 cm Grade 1, ALH, ER 80%, PR 0%, Ki 67: 5% Staging: T2Nx(Stage 2A) Patient refusedradiation therapy.  Current treatment: Adjuvant antiestrogen therapy with anastrozole 1 mg daily started 12/26/2015  Anastrozole toxicities: 1. Mild hot flashes 2. chronic musculoskeletal aches and pains but no different than prior to starting antiestrogen therapy.  Breast cancer surveillance: 1.Breast exam3/21/2022: Benign 2.mammograms at [facility]7/15/2021: Benign  I sent another prescription for anastrozole for 1 year Return to clinic in 1 year for follow-up

## 2022-05-08 ENCOUNTER — Other Ambulatory Visit: Payer: Self-pay | Admitting: Family Medicine

## 2022-05-08 ENCOUNTER — Ambulatory Visit
Admission: RE | Admit: 2022-05-08 | Discharge: 2022-05-08 | Disposition: A | Discharge status: Home / Self Care | Payer: Medicare Other | Source: Ambulatory Visit | Attending: Family Medicine | Admitting: Family Medicine

## 2022-05-08 DIAGNOSIS — R0782 Intercostal pain: Secondary | ICD-10-CM

## 2022-06-04 ENCOUNTER — Ambulatory Visit: Payer: Self-pay | Admitting: *Deleted

## 2022-06-04 NOTE — Telephone Encounter (Signed)
Reason for Disposition . [1] SEVERE pain (e.g., excruciating, unable to do any normal activities) AND [2] not improved after 2 hours of pain medicine  Answer Assessment - Initial Assessment Questions 1. LOCATION and RADIATION: "Where is the pain located?"      Right hip 2. QUALITY: "What does the pain feel like?"  (e.g., sharp, dull, aching, burning)     Hard to sit- "falls to sit" 3. SEVERITY: "How bad is the pain?" "What does it keep you from doing?"   (Scale 1-10; or mild, moderate, severe)   -  MILD (1-3): doesn't interfere with normal activities    -  MODERATE (4-7): interferes with normal activities (e.g., work or school) or awakens from sleep, limping    -  SEVERE (8-10): excruciating pain, unable to do any normal activities, unable to walk     Moderate/severe 4. ONSET: "When did the pain start?" "Does it come and go, or is it there all the time?"     1 week 5. WORK OR EXERCISE: "Has there been any recent work or exercise that involved this part of the body?"      Working to put up decorations 6. CAUSE: "What do you think is causing the hip pain?"      Not sure- recent falls 7. AGGRAVATING FACTORS: "What makes the hip pain worse?" (e.g., walking, climbing stairs, running)     walking  Protocols used: Hip Pain-A-AH

## 2022-06-04 NOTE — Telephone Encounter (Signed)
  Chief Complaint: hip pain Symptoms: pain in hip- does not want to fall Frequency: 1 week Pertinent Negatives: Patient denies   Disposition: '[]'$ ED /'[]'$ Urgent Care (no appt availability in office) / '[]'$ Appointment(In office/virtual)/ '[]'$  [facility]/ '[]'$ Home Care/ '[]'$ Refused Recommended Disposition /'[]'$ [facility]/ '[]'$  Follow-up with PCP Additional Notes: Patient and husband calling to see if X ray can be done at [facility]- advised yes.

## 2022-06-29 ENCOUNTER — Ambulatory Visit (INDEPENDENT_AMBULATORY_CARE_PROVIDER_SITE_OTHER): Payer: Medicare Other | Admitting: Physician Assistant

## 2022-06-29 ENCOUNTER — Ambulatory Visit (INDEPENDENT_AMBULATORY_CARE_PROVIDER_SITE_OTHER): Payer: Medicare Other

## 2022-06-29 ENCOUNTER — Encounter: Payer: Self-pay | Admitting: Physician Assistant

## 2022-06-29 VITALS — Ht 65.0 in | Wt 158.0 lb

## 2022-06-29 DIAGNOSIS — M25551 Pain in right hip: Secondary | ICD-10-CM

## 2022-06-29 DIAGNOSIS — M545 Low back pain, unspecified: Secondary | ICD-10-CM | POA: Diagnosis not present

## 2022-06-29 DIAGNOSIS — G8929 Other chronic pain: Secondary | ICD-10-CM

## 2022-06-29 NOTE — Progress Notes (Signed)
Office Visit Note   Patient: Rhonda [name]           Date of Birth: 06/07/1932           MRN: [id number] Visit Date: 06/29/2022              Requested by: Derinda Late, MD [address] [place],  [place] [address] PCP: Derinda Late, MD   Assessment & Plan: Visit Diagnoses:  1. Pain in right hip   2. Chronic bilateral low back pain, unspecified whether sciatica present     Plan: Rhonda [name] is a pleasant 89 year old woman who comes in today with a chief complaint of right buttock pain.  She denies any particular injury though notes this happened to 4 weeks ago when she was taking down Christmas decorations.  She denies any fall.  But, she admits her balance is not very good.  It has gotten slightly better however she uses a rollator to get around as she feels like without it she would have difficulties.  She denies any loss of bowel or bladder control.  She cannot take any anti-inflammatories but is taking Tylenol.  Although she has a lot of arthritis in her hip she actually has fairly good motion and no reproduction of the pain.  She is mostly tender over the posterior buttock consistent with some sciatica.  Does not have any radiation or loss of strength.  I think she would be a good candidate for some physical therapy.  She like to [facility] that [facility] in and I think this would be fine.  Would follow-up in 4 weeks if no better  Follow-Up Instructions: No follow-ups on file.   Orders:  Orders Placed This Encounter  Procedures   XR HIP UNILAT W OR W/O PELVIS 2-3 VIEWS RIGHT   Ambulatory referral to Physical Therapy   No orders of the defined types were placed in this encounter.     Procedures: No procedures performed   Clinical Data: No additional findings.   Subjective: Chief Complaint  Patient presents with   Right Hip - Pain    HPI Rhonda [name] is a pleasant active 89 year old woman with a 4-week history of right posterior buttock pain.   No particular fall or injury except this coordinates with taking down a lot of Christmas decorations from her home.  Denies any fever chills loss of bowel or bladder control numbing or tingling  Review of Systems  All other systems reviewed and are negative.    Objective: Vital Signs: Ht '5\' 5"'$  (1.651 m)   Wt 158 lb (71.7 kg)   BMI 26.29 kg/m   Physical Exam Constitutional:      Appearance: Normal appearance.  Pulmonary:     Effort: Pulmonary effort is normal.  Neurological:     General: No focal deficit present.     Mental Status: She is alert.   Ortho Exam Examination of her right hip she has good internal/external rotation without reproduction of symptoms in her groin.  She has some mild tenderness over the posterior buttock.  She has excellent strength with flexion and extension of her legs plantarflexion and dorsiflexion of her ankle.  Sensation is intact. Specialty Comments:  No specialty comments available.  Imaging: No results found.   PMFS History: Patient Active Problem List   Diagnosis Date Noted   Elevated serum immunoglobulin free light chains 07/06/2017   Degenerative tear of lateral meniscus of left knee 03/23/2017   Gait disorder  09/09/2016   Tremor, essential 09/09/2016   Breast cancer of upper-outer quadrant of left female breast ([facility]) 11/06/2015   Past Medical History:  Diagnosis Date   Arthritis    fingers   Benign essential tremor    on primadone   Breast cancer of upper-outer quadrant of left female breast ([facility]) 11/06/2015   Diabetes mellitus without complication (HCC)    Gait disorder 09/09/2016   GERD (gastroesophageal reflux disease)    Hyperlipidemia    Hypertension    Pneumonia    Tremor, essential 09/09/2016    Family History  Problem Relation Age of Onset   ALS Mother    Tremor Father    Heart attack Father    Tremor Sister    Tremor Brother     Past Surgical History:  Procedure Laterality Date   BREAST LUMPECTOMY WITH  RADIOACTIVE SEED LOCALIZATION Left 12/17/2015   Procedure: BREAST LUMPECTOMY WITH RADIOACTIVE SEED LOCALIZATION;  Surgeon: [name], MD;  Location: [facility];  Service: General;  Laterality: Left;   LEG SURGERY Left    for fracture   TUBAL LIGATION     WRIST SURGERY Left    Social History   Occupational History   Not on file  Tobacco Use   Smoking status: Never   Smokeless tobacco: Never  Substance and Sexual Activity   Alcohol use: Yes    Comment: social   Drug use: No   Sexual activity: Not on file

## 2023-08-19 ENCOUNTER — Other Ambulatory Visit (HOSPITAL_COMMUNITY): Payer: Self-pay | Admitting: Family Medicine

## 2023-08-19 DIAGNOSIS — I63531 Cerebral infarction due to unspecified occlusion or stenosis of right posterior cerebral artery: Secondary | ICD-10-CM

## 2023-08-23 ENCOUNTER — Ambulatory Visit (HOSPITAL_COMMUNITY)
Admission: RE | Admit: 2023-08-23 | Discharge: 2023-08-23 | Disposition: A | Discharge status: Home / Self Care | Source: Ambulatory Visit | Attending: Surgery | Admitting: Surgery

## 2023-08-23 ENCOUNTER — Other Ambulatory Visit (HOSPITAL_COMMUNITY): Payer: Self-pay | Admitting: Family Medicine

## 2023-08-23 DIAGNOSIS — I619 Nontraumatic intracerebral hemorrhage, unspecified: Secondary | ICD-10-CM | POA: Diagnosis present

## 2023-09-16 ENCOUNTER — Ambulatory Visit (HOSPITAL_COMMUNITY): Attending: Internal Medicine

## 2023-09-16 DIAGNOSIS — I63531 Cerebral infarction due to unspecified occlusion or stenosis of right posterior cerebral artery: Secondary | ICD-10-CM | POA: Diagnosis present

## 2023-09-16 LAB — ECHOCARDIOGRAM COMPLETE
AR max vel: 1.49 cm2
AV Area VTI: 1.49 cm2
AV Area mean vel: 1.54 cm2
AV Mean grad: 6 mmHg
AV Peak grad: 11 mmHg
Ao pk vel: 1.66 m/s
Calc EF: 52.9 %
S' Lateral: 2.24 cm
Single Plane A2C EF: 56.9 %
Single Plane A4C EF: 55.7 %

## 2023-10-23 NOTE — ED Provider Notes (Signed)
 Emergency Department Provider Note   History   Chief Complaint Fall (Pt stated that she fell on cement and hit her head. Pt complains of pain to area. -LOC, pt is on Plavix./)   HPI  Rhonda [name] is a 89 y.o. female who presents with a fall and possible syncopal episode today.  She was at a wedding, was waiting outside to be picked up afterwards, she states she turned 1 way in the next thing she knew she was on the ground, she states her right elbow and the top of her head hurt.  She is not sure if she passed out or lost consciousness.  No prodromal symptoms like chest pain or shortness of breath, she does not believe she tripped.  She states she has bad vision on the left peripheral side due to a stroke a few months ago and states that could have caused the fall.  She states she also had a fall a couple weeks ago.  Past Medical History:  Diagnosis Date  . Adult hypothyroidism   . Breast cancer    left  . Diabetes mellitus (CMS/HCC)   . GERD (gastroesophageal reflux disease)   . Gout   . High cholesterol   . Hypertension   . Renal disorder   . Stroke (CMS/HCC)     Past Surgical History:  Procedure Laterality Date  . APPENDECTOMY    . BREAST LUMPECTOMY Left   . BREAST LUMPECTOMY Left   . TUBAL LIGATION Bilateral Unable to recall    Medications No current facility-administered medications for this encounter.  Current Outpatient Medications:  .  allopurinol  (ZYLOPRIM  eqv) 100 mg tablet, Take 100 mg by mouth daily., Disp: , Rfl:  .  anastrozole  (ARIMIDEX  eqv) 1 mg chemo tablet, , Disp: , Rfl:  .  aspirin -calcium  carbonate 81 mg-300 mg calcium (777 mg) tablet, Take 81 mg by mouth daily., Disp: , Rfl:  .  cyclobenzaprine (FLEXERIL) 10 mg tablet, Take HALF tablet by mouth 2 (two) times a day as needed for muscle spasms for up to 3 days., Disp: 3 tablet, Rfl: 0 .  dulaglutide  (TRULICITY ) 1.5 mg/0.5 mL pen injector, Inject 5 mg under the skin 1 (one) time per week., Disp: ,  Rfl:  .  ezetimibe (ZETIA) 10 mg tablet, , Disp: , Rfl:  .  famotidine  (PEPCID ) 20 mg tablet, , Disp: , Rfl:  .  furosemide  (LASIX  eqv) 40 mg tablet, Take 20 mg by mouth daily., Disp: , Rfl:  .  hydrALAZINE  (APRESOLINE  eqv) 50 mg tablet, Take 50 mg by mouth 3 times a day. , Disp: , Rfl:  .  levothyroxine  (SYNTHROID , LEVOTHROID eqv) 50 mcg tablet, Take 50 mcg by mouth daily., Disp: , Rfl:  .  losartan  (COZAAR  eqv) 50 mg tablet, Take 50 mg by mouth daily., Disp: , Rfl:  .  metoprolol  succinate (TOPROL -XL eqv) 50 mg 24 hr tablet, Take 50 mg by mouth daily., Disp: , Rfl:  .  multivit-minerals/folic acid (CENTRUM MULTIGUMMIES ORAL), Take 1 tablet by mouth daily., Disp: , Rfl:  .  pravastatin  (PRAVACHOL  eqv) 40 mg tablet, Take 40 mg by mouth at bed time., Disp: , Rfl:  .  primidone  (MYSOLINE  eqv) 250 mg tablet, Take 125 mg by mouth 2 (two) times a day., Disp: , Rfl: 2 .  raNITIdine  HCl (ZANTAC ) 150 mg tablet, Take 150 mg by mouth twice a day., Disp: , Rfl:  .  rOPINIRole  (REQUIP  eqv) 1 mg tablet, Take 1.5 mg by mouth at bed  time., Disp: , Rfl:   Allergies Codeine  Family History Family History  Problem Relation Age of Onset  . Heart disease Father     Social History Social History   Tobacco Use  . Smoking status: Former  . Smokeless tobacco: Never  Substance Use Topics  . Alcohol use: Never  . Drug use: Never    Past medical, family, and social histories reviewed and verified by me.  Physical Exam   VITAL SIGNS:   ED Triage Vitals [10/23/23 1807]  Temp Heart Rate Resp BP  36.8 C (98.2 F) 76 17 172/80    SpO2 Temp Source Heart Rate Source Patient Position  98 % Oral Monitor Sitting    BP Location FiO2 (%)    Left arm --     Reviewed vital signs and nursing note as charted by RN/Triage.  Constitutional: Well appearing, no distress. Eyes: Conjunctivae normal, sclera non-icteric. ENT      Head: Normocephalic, atraumatic.      Nose: No congestion.      Mouth/Throat:  Mucous membranes are moist. Hematological/Lymphatic/Immunilogical: No cervical lymphadenopathy. Cardiovascular: Skin is well perfused, no JVD. Respiratory: Normal respiratory effort. No tachypnea. Gastrointestinal: Non-distended, nontender Back: The back appears normal. Musculoskeletal: No obvious deformities. No tenderness or signs of trauma to face or neck, other than hematoma to the right parietal versus occipital scalp.  No open wounds. Trachea midline no signs of trauma, swelling, or bleeding to neck No bruising, deformities, or tenderness to chest wall No tenderness to abdomen Hips stable and nontender No midline cervical, thoracic, or lumbar tenderness No long bone tenderness to upper or lower extremities, moving all extremities appropriately, she does have mild skin abrasion to the right elbow.  Neurologic: Alert, oriented. Normal speech and language. No gross focal neurologic deficits are appreciated. Skin: Skin is warm, dry. No rash noted. Psychiatric: Mood and affect are normal.     ED Clinical Impression   Final diagnoses:  [W19.XXXA] Fall, initial encounter    Initial Impression, ED Course, Assessment and Plan   Impression: 89 y.o. female with trauma, fall today.  We will conduct syncopal workup given that there is no good explanation for the fall, she does not believe she tripped.  Basic labs, EKG.  CT head and cervical spine.  Elbow x-ray.  This could have still been a mechanical fall.  Spoke to patient and husband about workup, and if this is reassuring likely discharge, they are on board with this plan.  History obtained by an independent source:   Life-threatening differential diagnoses include but are not limited to:   Comorbidities that add complexity to management:   External charts reviewed and significant for:   Discussed plan of care with:   I independently interpreted all studies and agree with radiology over-reads - independent interpretations can  be found in ED course.  Diagnostic tests and medications considered but not ordered:   Social determinants of health that affect care:   Shared decision making conversation had with patient at bedside. Shared decision making was utilized in all aspects of patient care (admission, discharge, medication administration, tests performed, etc. )  ED Course as of 10/25/23 0139  Sat Oct 23, 2023  1841 No urinary symptoms [JK]  1925 Received in signover - ground level fall, unclear cause, scalp hematoma.  Broad workup initiated for possible syncope.  Thus far - EKG reassuring, rate 75, sinus, LAE, RBBB, LAFB, LVH. PR 169, QRS 113, Qtc 467, no stemi or acute ischemic changes.  Morphology grossly similar to that seen in July 2019.  CT imaging of head and c-spine, cxr thus far reassuring.  Pending lab results and review.  [SS]  2029 High Sensitivity Troponin: 8 [SS]  2029 Basic metabolic panel(!) [SS]  2029 Basic metabolic panel(!) Stable CKD, mild hyperglycemia, otherwise no significant abnormalities.  [SS]  2029 Differential(!) No clinically significant abnormalities.  [SS]  2029 CBC and differential(!) Mild anemia, otherwise no clinically significant abnormalities.  [SS]  2031 Workup reassuring. Ambulatory without issue.  [SS]    ED Course User Index [JK] Fairy JINNY Glaze, MD [SS] Glendia LOISE Mercy, DO      Clinical Impressions as of 10/25/23 0139  Fall, initial encounter      Any labs and radiology results that were available during my care of the patient were independently reviewed by me and considered in my medical decision making.  Portions of this record have been created using Scientist, clinical (histocompatibility and immunogenetics). Dictation errors have been sought, but may not have been identified and corrected.   Pertinent labs & imaging results that were available during my care of the patient were reviewed by me and considered in my medical decision making (see chart for details).  This note  was transcribed using Dragon voice recognition software, and may contain inadvertent misspellings or incorrect transcriptions      Fairy JINNY Glaze, MD 10/23/23 1824    Fairy JINNY Glaze, MD 10/25/23 [phone]

## 2023-11-02 ENCOUNTER — Other Ambulatory Visit: Payer: Self-pay | Admitting: Neurology

## 2023-11-02 ENCOUNTER — Encounter: Payer: Self-pay | Admitting: *Deleted

## 2023-11-02 ENCOUNTER — Ambulatory Visit (INDEPENDENT_AMBULATORY_CARE_PROVIDER_SITE_OTHER): Admitting: Neurology

## 2023-11-02 ENCOUNTER — Telehealth: Payer: Self-pay | Admitting: Neurology

## 2023-11-02 ENCOUNTER — Encounter: Payer: Self-pay | Admitting: Neurology

## 2023-11-02 VITALS — BP 150/61 | HR 66 | Ht 64.0 in | Wt 154.0 lb

## 2023-11-02 DIAGNOSIS — R269 Unspecified abnormalities of gait and mobility: Secondary | ICD-10-CM

## 2023-11-02 DIAGNOSIS — I639 Cerebral infarction, unspecified: Secondary | ICD-10-CM | POA: Diagnosis not present

## 2023-11-02 DIAGNOSIS — I69398 Other sequelae of cerebral infarction: Secondary | ICD-10-CM

## 2023-11-02 DIAGNOSIS — Z8673 Personal history of transient ischemic attack (TIA), and cerebral infarction without residual deficits: Secondary | ICD-10-CM

## 2023-11-02 DIAGNOSIS — I2585 Chronic coronary microvascular dysfunction: Secondary | ICD-10-CM

## 2023-11-02 DIAGNOSIS — H53462 Homonymous bilateral field defects, left side: Secondary | ICD-10-CM

## 2023-11-02 NOTE — Patient Instructions (Signed)
 I had a long d/w patient and her husband about her subacute right occipital stroke, left-sided peripheral vision loss and gait and balance difficulties, risk for recurrent stroke/TIAs, personally independently reviewed imaging studies and stroke evaluation results and answered questions.Continue aspirin  81 mg daily  for secondary stroke prevention and maintain strict control of hypertension with blood pressure goal below 130/90, diabetes with hemoglobin A1c goal below 6.5% and lipids with LDL cholesterol goal below 70 mg/dL. I also advised the patient to eat a healthy diet with plenty of whole grains, cereals, fruits and vegetables, exercise regularly and maintain ideal body weight.  Recommend we check lipid profile, hemoglobin A1c, 30-day heart monitor for paroxysmal A-fib and CT angiogram of the brain and neck.  Encourage her to use a cane at all times and we discussed fall prevention precautions.  Followup in the future with me in 6 months or call earlier if necessary.  Fall Prevention in the Home, Adult Falls can cause injuries and affect people of all ages. There are many simple things that you can do to make your home safe and to help prevent falls. If you need it, ask for help making these changes. What actions can I take to prevent falls? General information Use good lighting in all rooms. Make sure to: Replace any light bulbs that burn out. Turn on lights if it is dark and use night-lights. Keep items that you use often in easy-to-reach places. Lower the shelves around your home if needed. Move furniture so that there are clear paths around it. Do not keep throw rugs or other things on the floor that can make you trip. If any of your floors are uneven, fix them. Add color or contrast paint or tape to clearly mark and help you see: Grab bars or handrails. First and last steps of staircases. Where the edge of each step is. If you use a ladder or stepladder: Make sure that it is fully  opened. Do not climb a closed ladder. Make sure the sides of the ladder are locked in place. Have someone hold the ladder while you use it. Know where your pets are as you move through your home. What can I do in the bathroom?     Keep the floor dry. Clean up any water that is on the floor right away. Remove soap buildup in the bathtub or shower. Buildup makes bathtubs and showers slippery. Use non-skid mats or decals on the floor of the bathtub or shower. Attach bath mats securely with double-sided, non-slip rug tape. If you need to sit down while you are in the shower, use a non-slip stool. Install grab bars by the toilet and in the bathtub and shower. Do not use towel bars as grab bars. What can I do in the bedroom? Make sure that you have a light by your bed that is easy to reach. Do not use any sheets or blankets on your bed that hang to the floor. Have a firm bench or chair with side arms that you can use for support when you get dressed. What can I do in the kitchen? Clean up any spills right away. If you need to reach something above you, use a sturdy step stool that has a grab bar. Keep electrical cables out of the way. Do not use floor polish or wax that makes floors slippery. What can I do with my stairs? Do not leave anything on the stairs. Make sure that you have a light switch at the  top and the bottom of the stairs. Have them installed if you do not have them. Make sure that there are handrails on both sides of the stairs. Fix handrails that are broken or loose. Make sure that handrails are as long as the staircases. Install non-slip stair treads on all stairs in your home if they do not have carpet. Avoid having throw rugs at the top or bottom of stairs, or secure the rugs with carpet tape to prevent them from moving. Choose a carpet design that does not hide the edge of steps on the stairs. Make sure that carpet is firmly attached to the stairs. Fix any carpet that is  loose or worn. What can I do on the outside of my home? Use bright outdoor lighting. Repair the edges of walkways and driveways and fix any cracks. Clear paths of anything that can make you trip, such as tools or rocks. Add color or contrast paint or tape to clearly mark and help you see high doorway thresholds. Trim any bushes or trees on the main path into your home. Check that handrails are securely fastened and in good repair. Both sides of all steps should have handrails. Install guardrails along the edges of any raised decks or porches. Have leaves, snow, and ice cleared regularly. Use sand, salt, or ice melt on walkways during winter months if you live where there is ice and snow. In the garage, clean up any spills right away, including grease or oil spills. What other actions can I take? Review your medicines with your health care provider. Some medicines can make you confused or feel dizzy. This can increase your chance of falling. Wear closed-toe shoes that fit well and support your feet. Wear shoes that have rubber soles and low heels. Use a cane, walker, scooter, or crutches that help you move around if needed. Talk with your provider about other ways that you can decrease your risk of falls. This may include seeing a physical therapist to learn to do exercises to improve movement and strength. Where to find more information Centers for Disease Control and Prevention, [employer]: TonerPromos.no [employer] on Aging: BaseRingTones.pl National Institute on Aging: BaseRingTones.pl Contact a health care provider if: You are afraid of falling at home. You feel weak, drowsy, or dizzy at home. You fall at home. Get help right away if you: Lose consciousness or have trouble moving after a fall. Have a fall that causes a head injury. These symptoms may be an emergency. Get help right away. Call 911. Do not wait to see if the symptoms will go away. Do not drive yourself to the hospital. This  information is not intended to replace advice given to you by your health care provider. Make sure you discuss any questions you have with your health care provider. Document Revised: 01/12/2022 Document Reviewed: 01/12/2022 Elsevier Patient Education  2024 Elsevier Inc.  Stroke Prevention Some medical conditions and behaviors can lead to a higher chance of having a stroke. You can help prevent a stroke by eating healthy, exercising, not smoking, and managing any medical conditions you have. Stroke is a leading cause of functional impairment. Primary prevention is particularly important because a majority of strokes are first-time events. Stroke changes the lives of not only those who experience a stroke but also their family and other caregivers. How can this condition affect me? A stroke is a medical emergency and should be treated right away. A stroke can lead to brain damage and can sometimes  be life-threatening. If a person gets medical treatment right away, there is a better chance of surviving and recovering from a stroke. What can increase my risk? The following medical conditions may increase your risk of a stroke: Cardiovascular disease. High blood pressure (hypertension). Diabetes. High cholesterol. Sickle cell disease. Blood clotting disorders (hypercoagulable state). Obesity. Sleep disorders (obstructive sleep apnea). Other risk factors include: Being older than age 52. Having a history of blood clots, stroke, or mini-stroke (transient ischemic attack, TIA). Genetic factors, such as race, ethnicity, or a family history of stroke. Smoking cigarettes or using other tobacco products. Taking birth control pills, especially if you also use tobacco. Heavy use of alcohol or drugs, especially cocaine and methamphetamine. Physical inactivity. What actions can I take to prevent this? Manage your health conditions High cholesterol levels. Eating a healthy diet is important for  preventing high cholesterol. If cholesterol cannot be managed through diet alone, you may need to take medicines. Take any prescribed medicines to control your cholesterol as told by your health care provider. Hypertension. To reduce your risk of stroke, try to keep your blood pressure below 130/80. Eating a healthy diet and exercising regularly are important for controlling blood pressure. If these steps are not enough to manage your blood pressure, you may need to take medicines. Take any prescribed medicines to control hypertension as told by your health care provider. Ask your health care provider if you should monitor your blood pressure at home. Have your blood pressure checked every year, even if your blood pressure is normal. Blood pressure increases with age and some medical conditions. Diabetes. Eating a healthy diet and exercising regularly are important parts of managing your blood sugar (glucose). If your blood sugar cannot be managed through diet and exercise, you may need to take medicines. Take any prescribed medicines to control your diabetes as told by your health care provider. Get evaluated for obstructive sleep apnea. Talk to your health care provider about getting a sleep evaluation if you snore a lot or have excessive sleepiness. Make sure that any other medical conditions you have, such as atrial fibrillation or atherosclerosis, are managed. Nutrition Follow instructions from your health care provider about what to eat or drink to help manage your health condition. These instructions may include: Reducing your daily calorie intake. Limiting how much salt (sodium) you use to 1,500 milligrams (mg) each day. Using only healthy fats for cooking, such as olive oil, canola oil, or sunflower oil. Eating healthy foods. You can do this by: Choosing foods that are high in fiber, such as whole grains, and fresh fruits and vegetables. Eating at least 5 servings of fruits and  vegetables a day. Try to fill one-half of your plate with fruits and vegetables at each meal. Choosing lean protein foods, such as lean cuts of meat, poultry without skin, fish, tofu, beans, and nuts. Eating low-fat dairy products. Avoiding foods that are high in sodium. This can help lower blood pressure. Avoiding foods that have saturated fat, trans fat, and cholesterol. This can help prevent high cholesterol. Avoiding processed and prepared foods. Counting your daily carbohydrate intake.  Lifestyle If you drink alcohol: Limit how much you have to: 0-1 drink a day for women who are not pregnant. 0-2 drinks a day for men. Know how much alcohol is in your drink. In the [place] one drink equals one 12 oz bottle of beer ( ), one 5 oz glass of wine ( ), or one 1 oz glass of hard liquor (44mL).  Do not use any products that contain nicotine or tobacco. These products include cigarettes, chewing tobacco, and vaping devices, such as e-cigarettes. If you need help quitting, ask your health care provider. Avoid secondhand smoke. Do not use drugs. Activity  Try to stay at a healthy weight. Get at least 30 minutes of exercise on most days, such as: Fast walking. Biking. Swimming. Medicines Take over-the-counter and prescription medicines only as told by your health care provider. Aspirin  or blood thinners (antiplatelets or anticoagulants) may be recommended to reduce your risk of forming blood clots that can lead to stroke. Avoid taking birth control pills. Talk to your health care provider about the risks of taking birth control pills if: You are over 33 years old. You smoke. You get very bad headaches. You have had a blood clot. Where to find more information American Stroke Association: www.strokeassociation.org Get help right away if: You or a loved one has any symptoms of a stroke. "BE FAST" is an easy way to remember the main warning signs of a stroke: B - Balance. Signs are  dizziness, sudden trouble walking, or loss of balance. E - Eyes. Signs are trouble seeing or a sudden change in vision. F - Face. Signs are sudden weakness or numbness of the face, or the face or eyelid drooping on one side. A - Arms. Signs are weakness or numbness in an arm. This happens suddenly and usually on one side of the body. S - Speech. Signs are sudden trouble speaking, slurred speech, or trouble understanding what people say. T - Time. Time to call emergency services. Write down what time symptoms started. You or a loved one has other signs of a stroke, such as: A sudden, severe headache with no known cause. Nausea or vomiting. Seizure. These symptoms may represent a serious problem that is an emergency. Do not wait to see if the symptoms will go away. Get medical help right away. Call your local emergency services (911 in the [place] Do not drive yourself to the hospital. Summary You can help to prevent a stroke by eating healthy, exercising, not smoking, limiting alcohol intake, and managing any medical conditions you may have. Do not use any products that contain nicotine or tobacco. These include cigarettes, chewing tobacco, and vaping devices, such as e-cigarettes. If you need help quitting, ask your health care provider. Remember "BE FAST" for warning signs of a stroke. Get help right away if you or a loved one has any of these signs. This information is not intended to replace advice given to you by your health care provider. Make sure you discuss any questions you have with your health care provider. Document Revised: 04/13/2022 Document Reviewed: 04/13/2022 Elsevier Patient Education  2024 [employer].

## 2023-11-02 NOTE — Progress Notes (Signed)
 [facility] [address] [place]. [place] [address] [phone]       OFFICE CONSULT NOTE  Ms. Rhonda [name] Date of Birth:  03/05/1933 Medical Record Number:  [id number]   Referring MD: Ritchie Cheshire  Reason for Referral: Stroke  HPI: Rhonda [name] is a pleasant 89 year old Caucasian lady seen today for initial office consultation visit for stroke.  She is accompanied by her husband.  History is obtained from them and review of electronic medical records.  I personally reviewed pertinent available imaging films in PACS. She has past medical history of hypertension, hyperlipidemia, diabetes, gastroesophageal reflux disease, benign essential tremor, arthritis and gait instability.  She states that in around mid March this year she noticed sudden onset of left-sided vision dysfunction.  This happened following a 4 to 5-day episode of terrible headaches.  She does have prior history of migraines.  She saw her primary care physician who treated her with what sounds like a steroid taper dose which got rid of the headaches but she noticed left-sided vision loss.  She did see a ophthalmologist but I do not have those reports to review today.  She had a CT scan of the head done on 08/11/2023 which shows age-indeterminate right medial occipital lobe infarct.  Subsequently she [facility] which confirmed right medial occipital subacute infarct and old bilateral cerebellar infarcts.  Carotid ultrasound on 08/23/2023 showed bilateral 1-39% stenosis.  2D echo on 09/16/2023 showed ejection fraction of 60 to 65%.  Left [facility] size was mildly dilated.  I do not have any lipid profile or hemoglobin A1c test results to review.  She does have remote history of cerebellar stroke in 2013 and was left with mild gait imbalance.  She did follow-up with Dr. Tilda Fogo in our office in 2018 and was found to have a mild benign essential tremor and gait ataxia.  Patient reports couple of falls  recently.  She fell twice in the last 3 weeks.  On 1 occasion she had gone for a wedding to [place] and was waiting to get into her car when she turned suddenly and lost her balance and fell on the concrete.  She was seen in the ER there and had a scalp hematoma but fortunately CT head showed no acute abnormalities.  She does use a cane for ambulation but knows that she has to move slowly and not make a sudden movement.  She is tolerating aspirin  well with minor bruising and no bleeding.  She states her blood pressure is under good control.  She is tolerating Pravachol  well without muscle aches and pains.  She has mild benign essential tremor which seems to be well-controlled on the current dose of Mysoline  of 125 mg twice daily and the tremor does not appear to be functionally limiting at this time. ROS:   14 system review of systems is positive for vision difficulty, headache, imbalance, fall, bruising all other systems negative  PMH:  Past Medical History:  Diagnosis Date   Arthritis    fingers   Benign essential tremor    on primadone   Breast cancer of upper-outer quadrant of left female breast (HCC) 11/06/2015   Diabetes mellitus without complication (HCC)    Gait disorder 09/09/2016   GERD (gastroesophageal reflux disease)    Hyperlipidemia    Hypertension    Pneumonia    Tremor, essential 09/09/2016    Social History:  Social History   Socioeconomic History   Marital status:  Married    Spouse name: Not on file   Number of children: Not on file   Years of education: Not on file   Highest education level: Not on file  Occupational History   Not on file  Tobacco Use   Smoking status: Never   Smokeless tobacco: Never  Substance and Sexual Activity   Alcohol use: Yes    Comment: social   Drug use: No   Sexual activity: Not on file  Other Topics Concern   Not on file  Social History Narrative   Not on file   Social Drivers of Health   Financial Resource Strain: Not on  file  Food Insecurity: Not on file  Transportation Needs: Not on file  Physical Activity: Not on file  Stress: Not on file  Social Connections: Not on file  Intimate Partner Violence: Not on file    Medications:   Current Outpatient Medications on File Prior to Visit  Medication Sig Dispense Refill   allopurinol  (ZYLOPRIM ) 100 MG tablet Take 1 tablet (100 mg total) by mouth daily.     clopidogrel (PLAVIX) 75 MG tablet Take 75 mg by mouth daily.     Dulaglutide  (TRULICITY ) 1.5 MG/0.5ML SOPN Inject into the skin once a week.     famotidine  (PEPCID ) 20 MG tablet Take 1 tablet (20 mg total) by mouth 2 (two) times daily.     furosemide  (LASIX ) 20 MG tablet Take 1 tablet (20 mg total) by mouth daily.     hydrALAZINE  (APRESOLINE ) 50 MG tablet Take 1 tablet (50 mg total) by mouth 3 (three) times daily.     levothyroxine  (SYNTHROID , LEVOTHROID) 50 MCG tablet Take 50 mcg by mouth daily before breakfast.     losartan  (COZAAR ) 50 MG tablet Take 50 mg by mouth daily.     metoprolol  succinate (TOPROL -XL) 50 MG 24 hr tablet Take 50 mg by mouth daily. Take with or immediately following a meal.     Multiple Vitamins-Minerals (MULTIVITAMIN & MINERAL PO) Take 1 tablet by mouth every morning.     pravastatin  (PRAVACHOL ) 40 MG tablet Take 1 tablet (40 mg total) by mouth at bedtime. 30 tablet 0   primidone  (MYSOLINE ) 250 MG tablet Take 0.5 tablets (125 mg total) by mouth 2 (two) times daily.     rOPINIRole  (REQUIP ) 1 MG tablet Take 1.5 mg by mouth at bedtime.     No current facility-administered medications on file prior to visit.    Allergies:   Allergies  Allergen Reactions   Codeine Other (See Comments)    Headache    Physical Exam General: well developed, well nourished, seated, in no evident distress Head: head normocephalic and atraumatic.   Neck: supple with no carotid or supraclavicular bruits Cardiovascular: regular rate and rhythm, no murmurs Musculoskeletal: no deformity Skin:  no  rash/petichiae Vascular:  Normal pulses all extremities  Neurologic Exam Mental Status: Awake and fully alert. Oriented to place and time. Recent and remote memory intact. Attention span, concentration and fund of knowledge appropriate. Mood and affect appropriate.  Diminished recall 2/3.  Able to name 11 animals which can walk on 4 legs.  Clock drawing 3/4. Cranial Nerves: Fundoscopic exam reveals sharp disc margins. Pupils equal, briskly reactive to light. Extraocular movements full without nystagmus. Visual fields show dense left homonymous hemianopsia to confrontation. Hearing intact. Facial sensation intact. Face, tongue, palate moves normally and symmetrically.  Motor: Normal bulk and tone. Normal strength in all tested extremity muscles.  Mild action tremor  left upper extremity greater than right.  No resting tremor.  No cogwheel rigidity or bradykinesia Sensory.: intact to touch , pinprick , position and vibratory sensation.  Coordination: Rapid alternating movements normal in all extremities. Finger-to-nose and heel-to-shin performed accurately bilaterally. Gait and Station: Arises from chair without difficulty. Stance is broad-based uses a cane.  Unsteady when she turns. Reflexes: 1+ and symmetric. Toes downgoing.   NIHSS  2 Modified Rankin  2   ASSESSMENT: 89 year old Caucasian lady with left-sided vision difficulties due to subacute right occipital infarct in March 2025.  Remote history of cerebellar infarct in 2013 with mild residual gait and balance difficulties.  Vascular risk factors of hypertension, hyperlipidemia, diabetes and cerebrovascular disease.  She also has mild age-appropriate cognitive impairment. Longstanding history of mild benign essential tremor well-controlled on current medication regimen of Mysoline .    PLAN:I had a long d/w patient and her husband about her subacute right occipital stroke, left-sided peripheral vision loss and gait and balance difficulties,  risk for recurrent stroke/TIAs, personally independently reviewed imaging studies and stroke evaluation results and answered questions.Continue aspirin  81 mg daily  for secondary stroke prevention and maintain strict control of hypertension with blood pressure goal below 130/90, diabetes with hemoglobin A1c goal below 6.5% and lipids with LDL cholesterol goal below 70 mg/dL. I also advised the patient to eat a healthy diet with plenty of whole grains, cereals, fruits and vegetables, exercise regularly and maintain ideal body weight.  Recommend we check lipid profile, hemoglobin A1c, 30-day heart monitor for paroxysmal A-fib and CT angiogram of the brain and neck.  Encouraged her to use a cane at all times and we discussed fall prevention precautions.  Patient was advised not to drive till her peripheral vision loss improves.  Patient was advised followup in the future with me in 6 months or call earlier if necessary. Greater than 50% time during this prolonged 60-minute consultation visit was spent in counseling and coordination of care about her occipital stroke, left-sided vision loss, benign/internal and gait and balance difficulties and answered questions. Ardella Beaver, MD Note: This document was prepared with digital dictation and possible smart phrase technology. Any transcriptional errors that result from this process are unintentional.

## 2023-11-02 NOTE — Progress Notes (Signed)
 Patient enrolled for [employer] to ship a 30 day cardiac event monitor to her address on file.  Letter with instructions mailed to patient.

## 2023-11-02 NOTE — Telephone Encounter (Signed)
 no auth required sent to GI [phone]

## 2023-11-02 NOTE — Addendum Note (Signed)
 Addended by: Elton Ham on: 11/02/2023 11:23 AM   Modules accepted: Orders

## 2023-11-03 ENCOUNTER — Ambulatory Visit: Payer: Self-pay | Admitting: Neurology

## 2023-11-03 LAB — LIPID PANEL
Chol/HDL Ratio: 2.9 ratio (ref 0.0–4.4)
Cholesterol, Total: 112 mg/dL (ref 100–199)
HDL: 39 mg/dL — ABNORMAL LOW (ref >39)
LDL Chol Calc (NIH): 30 mg/dL (ref 0–99)
Triglycerides: 287 mg/dL — ABNORMAL HIGH (ref 0–149)
VLDL Cholesterol Cal: 43 mg/dL — ABNORMAL HIGH (ref 5–40)

## 2023-11-03 LAB — HEMOGLOBIN A1C
Est. average glucose Bld gHb Est-mCnc: 154 mg/dL
Hgb A1c MFr Bld: 7 % — ABNORMAL HIGH (ref 4.8–5.6)

## 2023-11-03 NOTE — Progress Notes (Signed)
 Kindly inform the patient that cholesterol profile is mostly satisfactory except elevated triglycerides and diabetes control is not optimal hence see primary care physician for advice on treatment for this

## 2023-11-03 NOTE — Telephone Encounter (Signed)
 Spoke to patient gave labwork results Gave Dr.Sethi recommendations Pt states will follow up with pcp .Forward labwork results to pcp today . Pt thanked me fir calling

## 2023-11-03 NOTE — Telephone Encounter (Signed)
-----   Message from Rhonda [name] sent at 11/03/2023  8:30 AM EDT ----- Louann Rous inform the patient that cholesterol profile is mostly satisfactory except elevated triglycerides and diabetes control is not optimal hence see primary care physician for advice on treatment for this

## 2024-09-04 ENCOUNTER — Ambulatory Visit: Admitting: Neurology
